# Patient Record
Sex: Male | Born: 1960 | Race: White | Hispanic: No | Marital: Married | Smoking: Never smoker
Health system: Southern US, Community
[De-identification: ages and names within clinical notes are randomized; demographics above are authoritative.]

## PROBLEM LIST (undated history)

## (undated) HISTORY — PX: WISDOM TOOTH EXTRACTION: SHX21

## (undated) HISTORY — PX: KNEE ARTHROSCOPY: SUR90

## (undated) HISTORY — PX: BACK SURGERY: SHX140

## (undated) HISTORY — PX: POLYPECTOMY: SHX149

---

## 2002-08-10 ENCOUNTER — Encounter: Payer: Self-pay | Admitting: Orthopedic Surgery

## 2002-08-10 ENCOUNTER — Ambulatory Visit (HOSPITAL_COMMUNITY): Admission: RE | Admit: 2002-08-10 | Discharge: 2002-08-10 | Payer: Self-pay | Admitting: Orthopedic Surgery

## 2002-09-21 ENCOUNTER — Ambulatory Visit (HOSPITAL_COMMUNITY): Admission: RE | Admit: 2002-09-21 | Discharge: 2002-09-21 | Payer: Self-pay | Admitting: Orthopedic Surgery

## 2003-07-16 ENCOUNTER — Ambulatory Visit (HOSPITAL_COMMUNITY): Admission: RE | Admit: 2003-07-16 | Discharge: 2003-07-17 | Payer: Self-pay | Admitting: Neurological Surgery

## 2003-07-16 ENCOUNTER — Encounter: Payer: Self-pay | Admitting: Neurological Surgery

## 2013-08-31 ENCOUNTER — Encounter: Payer: Self-pay | Admitting: Internal Medicine

## 2013-10-15 ENCOUNTER — Ambulatory Visit (AMBULATORY_SURGERY_CENTER): Payer: Self-pay | Admitting: *Deleted

## 2013-10-15 VITALS — Ht 74.0 in | Wt 187.0 lb

## 2013-10-15 DIAGNOSIS — Z1211 Encounter for screening for malignant neoplasm of colon: Secondary | ICD-10-CM

## 2013-10-15 MED ORDER — MOVIPREP 100 G PO SOLR: ORAL | Status: DC

## 2013-10-15 NOTE — Progress Notes (Signed)
Patient denies any allergies to eggs or soy. 

## 2013-10-17 ENCOUNTER — Encounter: Payer: Self-pay | Admitting: Internal Medicine

## 2013-10-29 ENCOUNTER — Encounter: Payer: Self-pay | Admitting: Internal Medicine

## 2013-10-29 ENCOUNTER — Ambulatory Visit (AMBULATORY_SURGERY_CENTER): Payer: 59 | Admitting: Internal Medicine

## 2013-10-29 VITALS — BP 117/65 | HR 49 | Temp 97.5°F | Resp 16 | Ht 74.0 in | Wt 187.0 lb

## 2013-10-29 DIAGNOSIS — D126 Benign neoplasm of colon, unspecified: Secondary | ICD-10-CM

## 2013-10-29 DIAGNOSIS — Z1211 Encounter for screening for malignant neoplasm of colon: Secondary | ICD-10-CM

## 2013-10-29 HISTORY — PX: COLONOSCOPY: SHX174

## 2013-10-29 MED ORDER — SODIUM CHLORIDE 0.9 % IV SOLN: 500.0000 mL | INTRAVENOUS | Status: DC

## 2013-10-29 NOTE — Progress Notes (Signed)
Called to room to assist during endoscopic procedure.  Patient ID and intended procedure confirmed with present staff. Received instructions for my participation in the procedure from the performing physician.  

## 2013-10-29 NOTE — Patient Instructions (Signed)
YOU HAD AN ENDOSCOPIC PROCEDURE TODAY AT THE [facility]: Refer to the procedure report that was given to you for any specific questions about what was found during the examination.  If the procedure report does not answer your questions, please call your gastroenterologist to clarify.  If you requested that your care partner not be given the details of your procedure findings, then the procedure report has been included in a sealed envelope for you to review at your convenience later.  YOU SHOULD EXPECT: Some feelings of bloating in the abdomen. Passage of more gas than usual.  Walking can help get rid of the air that was put into your GI tract during the procedure and reduce the bloating. If you had a lower endoscopy (such as a colonoscopy or flexible sigmoidoscopy) you may notice spotting of blood in your stool or on the toilet paper. If you underwent a bowel prep for your procedure, then you may not have a normal bowel movement for a few days.  DIET: Your first meal following the procedure should be a light meal and then it is ok to progress to your normal diet.  A half-sandwich or bowl of soup is an example of a good first meal.  Heavy or fried foods are harder to digest and may make you feel nauseous or bloated.  Likewise meals heavy in dairy and vegetables can cause extra gas to form and this can also increase the bloating.  Drink plenty of fluids but you should avoid alcoholic beverages for 24 hours.  ACTIVITY: Your care partner should take you home directly after the procedure.  You should plan to take it easy, moving slowly for the rest of the day.  You can resume normal activity the day after the procedure however you should NOT DRIVE or use heavy machinery for 24 hours (because of the sedation medicines used during the test).    SYMPTOMS TO REPORT IMMEDIATELY: A gastroenterologist can be reached at any hour.  During normal business hours, 8:30 AM to 5:00 PM Monday through Friday,  call [phone].  After hours and on weekends, please call the GI answering service at [phone] who will take a message and have the physician on call contact you.   Following lower endoscopy (colonoscopy or flexible sigmoidoscopy):  Excessive amounts of blood in the stool  Significant tenderness or worsening of abdominal pains  Swelling of the abdomen that is new, acute  Fever of 100F or higher    FOLLOW UP: If any biopsies were taken you will be contacted by phone or by letter within the next 1-3 weeks.  Call your gastroenterologist if you have not heard about the biopsies in 3 weeks.  Our staff will call the home number listed on your records the next business day following your procedure to check on you and address any questions or concerns that you may have at that time regarding the information given to you following your procedure. This is a courtesy call and so if there is no answer at the home number and we have not heard from you through the emergency physician on call, we will assume that you have returned to your regular daily activities without incident.  SIGNATURES/CONFIDENTIALITY: You and/or your care partner have signed paperwork which will be entered into your electronic medical record.  These signatures attest to the fact that that the information above on your After Visit Summary has been reviewed and is understood.  Full responsibility of the confidentiality   of this discharge information lies with you and/or your care-partner.    Information on polyps given to you today 

## 2013-10-29 NOTE — Progress Notes (Signed)
Procedure ends, to recovery, report given and VSS. 

## 2013-10-29 NOTE — Op Note (Signed)
[facility] [address] [place], [address]   COLONOSCOPY PROCEDURE REPORT  PATIENT: Parker, Dunn  MR#: [id number] BIRTHDATE: May 29, 1961 , 52  yrs. old GENDER: Male ENDOSCOPIST: Roxy Cedar, MD REFERRED XB:[id number] Evlyn Kanner, M.D. PROCEDURE DATE:  10/29/2013 PROCEDURE:   Colonoscopy with snare polypectomy x 1 First Screening Colonoscopy - Avg.  risk and is 50 yrs.  old or older Yes.  Prior Negative Screening - Now for repeat screening. N/A  History of Adenoma - Now for follow-up colonoscopy & has been > or = to 3 yrs.  N/A  Polyps Removed Today? Yes. ASA CLASS:   Class I INDICATIONS:average risk screening. MEDICATIONS: MAC sedation, administered by CRNA and propofol (Diprivan) 300mg  IV  DESCRIPTION OF PROCEDURE:   After the risks benefits and alternatives of the procedure were thoroughly explained, informed consent was obtained.  A digital rectal exam revealed no abnormalities of the rectum.   The LB [id number]  endoscope was introduced through the anus and advanced to the cecum, which was identified by both the appendix and ileocecal valve. No adverse events experienced.   The quality of the prep was excellent, using MoviPrep  The instrument was then slowly withdrawn as the colon was fully examined.   COLON FINDINGS: A diminutive polyp was found in the ascending colon. A polypectomy was performed with a cold snare.  The resection was complete and the polyp tissue was completely retrieved.   The colon was otherwise normal.  There was no diverticulosis, inflammation, other polyps or cancers .  Retroflexed views revealed no abnormalities. The time to cecum=7 minutes 31 seconds.  Withdrawal time=12 minutes 24 seconds.  The scope was withdrawn and the procedure completed.  COMPLICATIONS: There were no complications.  ENDOSCOPIC IMPRESSION: 1.   Diminutive polyp was found in the ascending colon; polypectomy was performed with a cold snare 2.    The colon was otherwise normal  RECOMMENDATIONS: 1. Repeat colonoscopy in 5 years if polyp adenomatous; otherwise 10 years   eSigned:  Roxy Cedar, MD 10/29/2013 12:02 PM   cc: Adrian Prince, MD and The Patient

## 2013-10-30 ENCOUNTER — Telehealth: Payer: Self-pay

## 2013-10-30 NOTE — Telephone Encounter (Signed)
  Follow up Call-  Call back number 10/29/2013  Post procedure Call Back phone  # [phone]  Permission to leave phone message Yes     Patient questions:  Do you have a fever, pain , or abdominal swelling? no Pain Score  0 *  Have you tolerated food without any problems? yes  Have you been able to return to your normal activities? yes  Do you have any questions about your discharge instructions: Diet   no Medications  no Follow up visit  no  Do you have questions or concerns about your Care? no  Actions: * If pain score is 4 or above: No action needed, pain <4.

## 2013-11-02 ENCOUNTER — Encounter: Payer: Self-pay | Admitting: Internal Medicine

## 2018-12-20 ENCOUNTER — Encounter: Payer: Self-pay | Admitting: Internal Medicine

## 2019-01-02 ENCOUNTER — Encounter: Payer: Self-pay | Admitting: Internal Medicine

## 2019-01-05 ENCOUNTER — Ambulatory Visit (AMBULATORY_SURGERY_CENTER): Payer: Self-pay | Admitting: *Deleted

## 2019-01-05 VITALS — Ht 74.0 in | Wt 194.0 lb

## 2019-01-05 DIAGNOSIS — Z86010 Personal history of colonic polyps: Secondary | ICD-10-CM

## 2019-01-05 MED ORDER — NA SULFATE-K SULFATE-MG SULF 17.5-3.13-1.6 GM/177ML PO SOLN: ORAL | 0 refills | Status: DC

## 2019-01-05 NOTE — Progress Notes (Signed)
Patient denies any allergies to eggs or soy. Patient denies any problems with anesthesia/sedation. Patient denies any oxygen use at home. Patient denies taking any diet/weight loss medications or blood thinners. EMMI education offered, pt declined. Suprep coupon printed and given to pt.

## 2019-01-22 ENCOUNTER — Encounter: Payer: Self-pay | Admitting: Internal Medicine

## 2019-01-31 ENCOUNTER — Telehealth: Payer: Self-pay

## 2019-01-31 NOTE — Telephone Encounter (Signed)
Called patient and cancelled his procedure for 02/05/2019. I informed him that we would reschedule as soon as we were up and running again. Her agreed and understood.

## 2019-02-05 ENCOUNTER — Encounter: Payer: Self-pay | Admitting: Internal Medicine

## 2019-02-26 ENCOUNTER — Telehealth: Payer: Self-pay | Admitting: *Deleted

## 2019-02-26 NOTE — Telephone Encounter (Signed)
Called patient to reschedule colonoscopy previously cancelled due to Covid-19. Rescheduled with patient to Mar 14, 2019 at 100 pm. Pt has prep, new prep instructions completed and mailed to patient.

## 2019-03-03 ENCOUNTER — Emergency Department (HOSPITAL_BASED_OUTPATIENT_CLINIC_OR_DEPARTMENT_OTHER): Payer: PRIVATE HEALTH INSURANCE

## 2019-03-03 ENCOUNTER — Inpatient Hospital Stay (HOSPITAL_BASED_OUTPATIENT_CLINIC_OR_DEPARTMENT_OTHER)
Admission: EM | Admit: 2019-03-03 | Discharge: 2019-03-06 | DRG: 390 | Disposition: A | Discharge status: Home / Self Care | Payer: PRIVATE HEALTH INSURANCE | Attending: Surgery | Admitting: Surgery

## 2019-03-03 ENCOUNTER — Encounter (HOSPITAL_BASED_OUTPATIENT_CLINIC_OR_DEPARTMENT_OTHER): Payer: Self-pay

## 2019-03-03 ENCOUNTER — Other Ambulatory Visit: Payer: Self-pay

## 2019-03-03 DIAGNOSIS — Z0189 Encounter for other specified special examinations: Secondary | ICD-10-CM

## 2019-03-03 DIAGNOSIS — K56609 Unspecified intestinal obstruction, unspecified as to partial versus complete obstruction: Principal | ICD-10-CM | POA: Diagnosis present

## 2019-03-03 DIAGNOSIS — Z79899 Other long term (current) drug therapy: Secondary | ICD-10-CM | POA: Diagnosis not present

## 2019-03-03 DIAGNOSIS — Z86010 Personal history of colonic polyps: Secondary | ICD-10-CM

## 2019-03-03 LAB — CBC WITH DIFFERENTIAL/PLATELET
Abs Immature Granulocytes: 0.03 10*3/uL (ref 0.00–0.07)
Basophils Absolute: 0 10*3/uL (ref 0.0–0.1)
Basophils Relative: 0 %
Eosinophils Absolute: 0 10*3/uL (ref 0.0–0.5)
Eosinophils Relative: 0 %
HCT: 42.1 % (ref 39.0–52.0)
Hemoglobin: 13.8 g/dL (ref 13.0–17.0)
Immature Granulocytes: 0 %
Lymphocytes Relative: 14 %
Lymphs Abs: 1.1 10*3/uL (ref 0.7–4.0)
MCH: 27.9 pg (ref 26.0–34.0)
MCHC: 32.8 g/dL (ref 30.0–36.0)
MCV: 85.2 fL (ref 80.0–100.0)
Monocytes Absolute: 0.4 10*3/uL (ref 0.1–1.0)
Monocytes Relative: 6 %
Neutro Abs: 6.3 10*3/uL (ref 1.7–7.7)
Neutrophils Relative %: 80 %
Platelets: 177 10*3/uL (ref 150–400)
RBC: 4.94 MIL/uL (ref 4.22–5.81)
RDW: 12.8 % (ref 11.5–15.5)
WBC: 7.9 10*3/uL (ref 4.0–10.5)
nRBC: 0 % (ref 0.0–0.2)

## 2019-03-03 LAB — URINALYSIS, ROUTINE W REFLEX MICROSCOPIC
Bilirubin Urine: NEGATIVE
Glucose, UA: NEGATIVE mg/dL
Hgb urine dipstick: NEGATIVE
Ketones, ur: 80 mg/dL — AB
Leukocytes,Ua: NEGATIVE
Nitrite: NEGATIVE
Protein, ur: NEGATIVE mg/dL
Specific Gravity, Urine: >1.046 — ABNORMAL HIGH (ref 1.005–1.030)
pH: 8 (ref 5.0–8.0)

## 2019-03-03 LAB — LIPASE, BLOOD: Lipase: 36 U/L (ref 11–51)

## 2019-03-03 LAB — COMPREHENSIVE METABOLIC PANEL
ALT: 20 U/L (ref 0–44)
AST: 29 U/L (ref 15–41)
Albumin: 4 g/dL (ref 3.5–5.0)
Alkaline Phosphatase: 43 U/L (ref 38–126)
Anion gap: 8 (ref 5–15)
BUN: 17 mg/dL (ref 6–20)
CO2: 25 mmol/L (ref 22–32)
Calcium: 9 mg/dL (ref 8.9–10.3)
Chloride: 102 mmol/L (ref 98–111)
Creatinine, Ser: 0.83 mg/dL (ref 0.61–1.24)
GFR calc Af Amer: >60 mL/min (ref >60)
GFR calc non Af Amer: >60 mL/min (ref >60)
Glucose, Bld: 123 mg/dL — ABNORMAL HIGH (ref 70–99)
Potassium: 3.7 mmol/L (ref 3.5–5.1)
Sodium: 135 mmol/L (ref 135–145)
Total Bilirubin: 0.8 mg/dL (ref 0.3–1.2)
Total Protein: 6.4 g/dL — ABNORMAL LOW (ref 6.5–8.1)

## 2019-03-03 LAB — TROPONIN I: Troponin I: <0.03 ng/mL (ref <0.03)

## 2019-03-03 MED ORDER — LIDOCAINE VISCOUS HCL 2 % MT SOLN
15.0000 mL | Freq: Once | OROMUCOSAL | Status: DC
  Filled 2019-03-03: qty 15

## 2019-03-03 MED ORDER — ONDANSETRON HCL 4 MG/2ML IJ SOLN: 4.0000 mg | Freq: Four times a day (QID) | INTRAMUSCULAR | Status: DC | PRN

## 2019-03-03 MED ORDER — HYDROMORPHONE HCL 1 MG/ML IJ SOLN
1.0000 mg | Freq: Once | INTRAMUSCULAR | Status: AC
  Administered 2019-03-03: 1 mg via INTRAVENOUS
  Filled 2019-03-03: qty 1

## 2019-03-03 MED ORDER — ONDANSETRON HCL 4 MG/2ML IJ SOLN
4.0000 mg | Freq: Once | INTRAMUSCULAR | Status: AC
  Administered 2019-03-03: 17:00:00 4 mg via INTRAVENOUS
  Filled 2019-03-03: qty 2

## 2019-03-03 MED ORDER — ALUM & MAG HYDROXIDE-SIMETH 200-200-20 MG/5ML PO SUSP: 30.0000 mL | Freq: Four times a day (QID) | ORAL | Status: DC | PRN

## 2019-03-03 MED ORDER — DIPHENHYDRAMINE HCL 12.5 MG/5ML PO ELIX: 12.5000 mg | ORAL_SOLUTION | Freq: Four times a day (QID) | ORAL | Status: DC | PRN

## 2019-03-03 MED ORDER — LACTATED RINGERS IV BOLUS: 1000.0000 mL | Freq: Three times a day (TID) | INTRAVENOUS | Status: AC | PRN

## 2019-03-03 MED ORDER — GUAIFENESIN-DM 100-10 MG/5ML PO SYRP: 10.0000 mL | ORAL_SOLUTION | ORAL | Status: DC | PRN

## 2019-03-03 MED ORDER — LIP MEDEX EX OINT: 1.0000 "application " | TOPICAL_OINTMENT | Freq: Two times a day (BID) | CUTANEOUS | Status: DC

## 2019-03-03 MED ORDER — LACTATED RINGERS IV SOLN
INTRAVENOUS | Status: DC
  Administered 2019-03-03 – 2019-03-04 (×3): via INTRAVENOUS

## 2019-03-03 MED ORDER — DIPHENHYDRAMINE HCL 50 MG/ML IJ SOLN: 12.5000 mg | Freq: Four times a day (QID) | INTRAMUSCULAR | Status: DC | PRN

## 2019-03-03 MED ORDER — PHENOL 1.4 % MT LIQD: 1.0000 | OROMUCOSAL | Status: DC | PRN

## 2019-03-03 MED ORDER — MENTHOL 3 MG MT LOZG
1.0000 | LOZENGE | OROMUCOSAL | Status: DC | PRN
  Filled 2019-03-03: qty 9

## 2019-03-03 MED ORDER — KETOROLAC TROMETHAMINE 15 MG/ML IJ SOLN
15.0000 mg | Freq: Four times a day (QID) | INTRAMUSCULAR | Status: DC | PRN
  Administered 2019-03-04 (×2): 30 mg via INTRAVENOUS
  Filled 2019-03-03 (×4): qty 2

## 2019-03-03 MED ORDER — ONDANSETRON 4 MG PO TBDP: 4.0000 mg | ORAL_TABLET | Freq: Four times a day (QID) | ORAL | Status: DC | PRN

## 2019-03-03 MED ORDER — METOCLOPRAMIDE HCL 5 MG/ML IJ SOLN: 5.0000 mg | Freq: Three times a day (TID) | INTRAMUSCULAR | Status: DC | PRN

## 2019-03-03 MED ORDER — ACETAMINOPHEN 650 MG RE SUPP: 650.0000 mg | Freq: Four times a day (QID) | RECTAL | Status: DC | PRN

## 2019-03-03 MED ORDER — HYDROCORTISONE (PERIANAL) 2.5 % EX CREA: 1.0000 "application " | TOPICAL_CREAM | Freq: Four times a day (QID) | CUTANEOUS | Status: DC | PRN

## 2019-03-03 MED ORDER — MAGIC MOUTHWASH
15.0000 mL | Freq: Four times a day (QID) | ORAL | Status: DC | PRN
  Filled 2019-03-03: qty 15

## 2019-03-03 MED ORDER — LACTATED RINGERS IV BOLUS
1000.0000 mL | Freq: Once | INTRAVENOUS | Status: AC
  Administered 2019-03-03: 1000 mL via INTRAVENOUS

## 2019-03-03 MED ORDER — HYDROCORTISONE 1 % EX CREA: 1.0000 "application " | TOPICAL_CREAM | Freq: Three times a day (TID) | CUTANEOUS | Status: DC | PRN

## 2019-03-03 MED ORDER — MORPHINE SULFATE (PF) 4 MG/ML IV SOLN
4.0000 mg | Freq: Once | INTRAVENOUS | Status: AC
  Administered 2019-03-03: 4 mg via INTRAVENOUS
  Filled 2019-03-03: qty 1

## 2019-03-03 MED ORDER — DIATRIZOATE MEGLUMINE & SODIUM 66-10 % PO SOLN
90.0000 mL | Freq: Once | ORAL | Status: AC
  Administered 2019-03-04: 90 mL via NASOGASTRIC
  Filled 2019-03-03 (×2): qty 90

## 2019-03-03 MED ORDER — IOHEXOL 300 MG/ML  SOLN
100.0000 mL | Freq: Once | INTRAMUSCULAR | Status: AC | PRN
  Administered 2019-03-03: 100 mL via INTRAVENOUS

## 2019-03-03 MED ORDER — PROCHLORPERAZINE EDISYLATE 10 MG/2ML IJ SOLN: 5.0000 mg | INTRAMUSCULAR | Status: DC | PRN

## 2019-03-03 MED ORDER — ACETAMINOPHEN 325 MG PO TABS
650.0000 mg | ORAL_TABLET | Freq: Four times a day (QID) | ORAL | Status: DC | PRN
  Administered 2019-03-06: 650 mg via ORAL
  Filled 2019-03-03: qty 2

## 2019-03-03 MED ORDER — SODIUM CHLORIDE 0.9 % IV BOLUS
1000.0000 mL | Freq: Once | INTRAVENOUS | Status: AC
  Administered 2019-03-03: 1000 mL via INTRAVENOUS

## 2019-03-03 MED ORDER — METHOCARBAMOL 1000 MG/10ML IJ SOLN
1000.0000 mg | Freq: Four times a day (QID) | INTRAVENOUS | Status: DC | PRN
  Filled 2019-03-03: qty 10

## 2019-03-03 MED ORDER — ENALAPRILAT 1.25 MG/ML IV SOLN
0.6250 mg | Freq: Four times a day (QID) | INTRAVENOUS | Status: DC | PRN
  Filled 2019-03-03: qty 1

## 2019-03-03 MED ORDER — HYDROMORPHONE HCL 1 MG/ML IJ SOLN
0.5000 mg | INTRAMUSCULAR | Status: DC | PRN
  Administered 2019-03-03: 1 mg via INTRAVENOUS
  Administered 2019-03-04: 0.5 mg via INTRAVENOUS
  Administered 2019-03-04: 1 mg via INTRAVENOUS
  Filled 2019-03-03 (×3): qty 1

## 2019-03-03 MED ORDER — ENOXAPARIN SODIUM 40 MG/0.4ML ~~LOC~~ SOLN
40.0000 mg | Freq: Every day | SUBCUTANEOUS | Status: DC
  Filled 2019-03-03: qty 0.4

## 2019-03-03 MED ORDER — SIMETHICONE 80 MG PO CHEW: 40.0000 mg | CHEWABLE_TABLET | Freq: Four times a day (QID) | ORAL | Status: DC | PRN

## 2019-03-03 MED ORDER — BISACODYL 10 MG RE SUPP: 10.0000 mg | Freq: Two times a day (BID) | RECTAL | Status: DC | PRN

## 2019-03-03 NOTE — H&P (Signed)
Parker Dunn  Jun 30, 1961 [id number]  CARE TEAM:  PCP: Reynold Bowen, MD  Outpatient Care Team: Patient Care Team: Reynold Bowen, MD as PCP - General (Endocrinology) Irene Shipper, MD as Consulting Physician (Gastroenterology)  Inpatient Treatment Team: Treatment Team: Attending Provider: Quintella Reichert, MD; Physician Assistant: Bishop Dublin; Registered Nurse: Tamera Reason, RN; Technician: Hezzie Bump, EMT; Consulting Physician: Edison Pace, Md, MD   This patient is a 58 y.o.male who presents today for surgical evaluation at the request of Sherwood Gambler.   Chief complaint / Reason for evaluation: SBO  58 year old male with abdominal cramping earlier today.  Usually does long bike rides.  Persistent cramping through doing his chores.  No sick contacts.  No diarrhea.  Pain is migrated more towards the lower abdomen.  Initially called emergency services.  Then felt little bit better and was changing his mind when he had much more intense pain.  Based concerns came to [place].  Does have a history of a sessile serrated polyp removed in 2014.  He was due for a follow-up 5-year colonoscopy when the COVID pandemic happened, so that is been stable.  Usually moves his bowels every day.  He is quite physically active and bicycles 30 miles at a time.  He denies any abdominal surgeries.  No hematochezia or melena.  No food poisoning.  No personal nor family history of GI/colon cancer, inflammatory bowel disease, irritable bowel syndrome, allergy such as Celiac Sprue, dietary/dairy problems, colitis, ulcers nor gastritis.  No recent sick contacts/gastroenteritis.  No travel outside the country.  No changes in diet.  No dysphagia to solids or liquids.  No significant heartburn or reflux.  No hematochezia, hematemesis, coffee ground emesis.  No evidence of prior gastric/peptic ulceration.    Assessment  Parker Dunn  58 y.o. male       Problem List:   Active Problems:   SBO (small bowel obstruction) ([place])   History of SESSILE SERRATED POLYP colon 2014   Crampy abdominal pain with dilated small bowel suspicious for bowel obstruction in a patient with no prior abdominal surgeries.  Plan:  Admit.  IV fluids.  Pain control.  Nausea control.  Nasogastric tube decompression.  Small bowel protocol.  If he has worsening pain or does not clear up in a few days, may require surgery.  -VTE prophylaxis- SCDs, etc -mobilize as tolerated to help recovery  35 minutes spent in review, evaluation, examination, counseling, and coordination of care.  More than 50% of that time was spent in counseling.  Adin Hector, MD, FACS, MASCRS Gastrointestinal and Minimally Invasive Surgery    [address] [place], [place] [address] [phone] Main / Paging [phone] Fax   03/03/2019      History reviewed. No pertinent past medical history.  Past Surgical History:  Procedure Laterality Date  . BACK SURGERY     x2  . COLONOSCOPY  10/29/2013  . KNEE ARTHROSCOPY     x2  . POLYPECTOMY    . WISDOM TOOTH EXTRACTION      Social History   Socioeconomic History  . Marital status: Married    Spouse name: Not on file  . Number of children: Not on file  . Years of education: Not on file  . Highest education level: Not on file  Occupational History  . Not on file  Social Needs  . Financial resource strain: Not on file  . Food insecurity:  Worry: Not on file    Inability: Not on file  . Transportation needs:    Medical: Not on file    Non-medical: Not on file  Tobacco Use  . Smoking status: Never Smoker  . Smokeless tobacco: Never Used  Substance and Sexual Activity  . Alcohol use: Yes    Alcohol/week: 2.0 standard drinks    Types: 2 Standard drinks or equivalent per week  . Drug use: No  . Sexual activity: Not on file  Lifestyle  . Physical activity:    Days per week: Not on file    Minutes  per session: Not on file  . Stress: Not on file  Relationships  . Social connections:    Talks on phone: Not on file    Gets together: Not on file    Attends religious service: Not on file    Active member of club or organization: Not on file    Attends meetings of clubs or organizations: Not on file    Relationship status: Not on file  . Intimate partner violence:    Fear of current or ex partner: Not on file    Emotionally abused: Not on file    Physically abused: Not on file    Forced sexual activity: Not on file  Other Topics Concern  . Not on file  Social History Narrative  . Not on file    Family History  Problem Relation Age of Onset  . Breast cancer Mother   . Colon cancer Neg Hx   . Colon polyps Neg Hx   . Esophageal cancer Neg Hx   . Stomach cancer Neg Hx   . Rectal cancer Neg Hx     Current Facility-Administered Medications  Medication Dose Route Frequency Provider Last Rate Last Dose  . diatrizoate meglumine-sodium (GASTROGRAFIN) 66-10 % solution 90 mL  90 mL Per NG tube Once Michael Boston, MD      . lactated ringers bolus 1,000 mL  1,000 mL Intravenous Q8H PRN Hezzie Karim, Remo Lipps, MD      . lidocaine (XYLOCAINE) 2 % viscous mouth solution 15 mL  15 mL Mouth/Throat Once Sherwood Gambler, MD       Current Outpatient Medications  Medication Sig Dispense Refill  . Cholecalciferol (VITAMIN D PO) Take 1 tablet by mouth daily.    . Na Sulfate-K Sulfate-Mg Sulf 17.5-3.13-1.6 GM/177ML SOLN Suprep (no substitutions)-TAKE AS DIRECTED. 354 mL 0     No Known Allergies  ROS:   All other systems reviewed & are negative except per HPI or as noted below: Constitutional:  No fevers, chills, sweats.  Weight stable Eyes:  No vision changes, No discharge HENT:  No sore throats, nasal drainage Lymph: No neck swelling, No bruising easily Pulmonary:  No cough, productive sputum CV: No orthopnea, PND  Patient bicycles 30 miles without difficulty.  No exertional chest/neck/shoulder/arm  pain. GI: No personal nor family history of GI/colon cancer, inflammatory bowel disease, irritable bowel syndrome, allergy such as Celiac Sprue, dietary/dairy problems, colitis, ulcers nor gastritis.  No recent sick contacts/gastroenteritis.  No travel outside the country.  No changes in diet. Renal: No UTIs, No hematuria Genital:  No drainage, bleeding, masses Musculoskeletal: No severe joint pain.  Good ROM major joints Skin:  No sores or lesions.  No rashes Heme/Lymph:  No easy bleeding.  No swollen lymph nodes Neuro: No focal weakness/numbness.  No seizures Psych: No suicidal ideation.  No hallucinations  BP (!) 153/72   Pulse (!) 57  Temp 97.7 F (36.5 C) (Oral)   Resp 20   Ht 6\' 2"  (1.88 m)   Wt 81.6 kg   SpO2 100%   BMI 23.11 kg/m   Physical Exam: General: Pt awake/alert/oriented x4 in mild major acute distress.  Looks tired and uncomfortable but not toxic Eyes: PERRL, normal EOM. Sclera nonicteric Neuro: CN II-XII intact w/o focal sensory/motor deficits. Lymph: No head/neck/groin lymphadenopathy Psych:  No delerium/psychosis/paranoia HENT: Normocephalic, Mucus membranes moist.  No thrush Neck: Supple, No tracheal deviation Chest: No pain.  Good respiratory excursion. CV:  Pulses intact.  Regular rhythm Abdomen: Mostly soft.  Some central discomfort.  Mild discomfort with percussion but no severe peritonitis.  Mild diastases.  Umbilicus flat without any obvious hernia.   Gen:  No inguinal hernias.  No inguinal lymphadenopathy.   Ext:  SCDs BLE.  No significant edema.  No cyanosis Skin: No petechiae / purpurea.  No major sores Musculoskeletal: No severe joint pain.  Good ROM major joints   Results:   Labs: Results for orders placed or performed during the hospital encounter of 03/03/19 (from the past 48 hour(s))  Comprehensive metabolic panel     Status: Abnormal   Collection Time: 03/03/19  3:09 PM  Result Value Ref Range   Sodium 135 135 - 145 mmol/L   Potassium  3.7 3.5 - 5.1 mmol/L   Chloride 102 98 - 111 mmol/L   CO2 25 22 - 32 mmol/L   Glucose, Bld 123 (H) 70 - 99 mg/dL   BUN 17 6 - 20 mg/dL   Creatinine, Ser 0.83 0.61 - 1.24 mg/dL   Calcium 9.0 8.9 - 10.3 mg/dL   Total Protein 6.4 (L) 6.5 - 8.1 g/dL   Albumin 4.0 3.5 - 5.0 g/dL   AST 29 15 - 41 U/L   ALT 20 0 - 44 U/L   Alkaline Phosphatase 43 38 - 126 U/L   Total Bilirubin 0.8 0.3 - 1.2 mg/dL   GFR calc non Af Amer >60 >60 mL/min   GFR calc Af Amer >60 >60 mL/min   Anion gap 8 5 - 15    Comment: Performed at [facility], [address] [place], [place] [address]  Lipase, blood     Status: None   Collection Time: 03/03/19  3:09 PM  Result Value Ref Range   Lipase 36 11 - 51 U/L    Comment: Performed at [facility], [address] [place], [place] [address]  CBC with Differential     Status: None   Collection Time: 03/03/19  3:09 PM  Result Value Ref Range   WBC 7.9 4.0 - 10.5 K/uL   RBC 4.94 4.22 - 5.81 MIL/uL   Hemoglobin 13.8 13.0 - 17.0 g/dL   HCT 42.1 39.0 - 52.0 %   MCV 85.2 80.0 - 100.0 fL   MCH 27.9 26.0 - 34.0 pg   MCHC 32.8 30.0 - 36.0 g/dL   RDW 12.8 11.5 - 15.5 %   Platelets 177 150 - 400 K/uL   nRBC 0.0 0.0 - 0.2 %   Neutrophils Relative % 80 %   Neutro Abs 6.3 1.7 - 7.7 K/uL   Lymphocytes Relative 14 %   Lymphs Abs 1.1 0.7 - 4.0 K/uL   Monocytes Relative 6 %   Monocytes Absolute 0.4 0.1 - 1.0 K/uL   Eosinophils Relative 0 %   Eosinophils Absolute 0.0 0.0 - 0.5 K/uL   Basophils Relative 0 %  Basophils Absolute 0.0 0.0 - 0.1 K/uL   Immature Granulocytes 0 %   Abs Immature Granulocytes 0.03 0.00 - 0.07 K/uL    Comment: Performed at [facility], [address] [place], [place] [address]  Troponin I - Once     Status: None   Collection Time: 03/03/19  3:09 PM  Result Value Ref Range   Troponin I <0.03 <0.03 ng/mL    Comment: Performed at [facility], [address] [place], [place] [address]  Urinalysis,  Routine w reflex microscopic     Status: Abnormal   Collection Time: 03/03/19  9:16 PM  Result Value Ref Range   Color, Urine YELLOW YELLOW   APPearance CLEAR CLEAR   Specific Gravity, Urine >1.046 (H) 1.005 - 1.030   pH 8.0 5.0 - 8.0   Glucose, UA NEGATIVE NEGATIVE mg/dL   Hgb urine dipstick NEGATIVE NEGATIVE   Bilirubin Urine NEGATIVE NEGATIVE   Ketones, ur 80 (A) NEGATIVE mg/dL   Protein, ur NEGATIVE NEGATIVE mg/dL   Nitrite NEGATIVE NEGATIVE   Leukocytes,Ua NEGATIVE NEGATIVE    Comment: Performed at [facility], [place] [address] [place], [place] [address]    Imaging / Studies: Ct Abdomen Pelvis W Contrast  Result Date: 03/03/2019 CLINICAL DATA:  Abdominal pain and tenderness for a day. EXAM: CT ABDOMEN AND PELVIS WITH CONTRAST TECHNIQUE: Multidetector CT imaging of the abdomen and pelvis was performed using the standard protocol following bolus administration of intravenous contrast. CONTRAST:  166mL OMNIPAQUE IOHEXOL 300 MG/ML  SOLN COMPARISON:  None. FINDINGS: Lower chest: No acute abnormality. Hepatobiliary: No focal liver abnormality is seen. No gallstones, gallbladder wall thickening, or biliary dilatation. Pancreas: Unremarkable. No pancreatic ductal dilatation or surrounding inflammatory changes. Spleen: Normal in size without focal abnormality. Adrenals/Urinary Tract: Calcification in the left adrenal gland with no associated mass, likely from previous hemorrhage or infection. Right adrenal gland is normal. There is a tiny cyst in the left kidney. No suspicious renal masses are noted. No hydronephrosis. The ureters and bladder are normal. Stomach/Bowel: The stomach is normal. Multiple dilated loops of small bowel are identified from the mid jejunum to the proximal to mid ileum. The transition point appears to be in the mid anterior abdomen in approximately the location of coronal image 31 and axial image 54. A discrete transition point is not seen. The more distal  small bowel is decompressed. There is fecal loading in the proximal colon. The remainder of the colon is unremarkable. The appendix is not well seen but there is no secondary evidence of appendicitis. Vascular/Lymphatic: Mild atherosclerotic changes in the nonaneurysmal aorta. The SMV is not well opacified, thought to be due to timing of contrast. Reproductive: Prostate is unremarkable. Other: No free air or free fluid. Musculoskeletal: No acute or significant osseous findings. IMPRESSION: 1. The findings are consistent with a small-bowel obstruction. While a discrete transition point is not seen, I suspect the transition point is likely in the proximal to mid ileum in the anterior central abdomen. An underlying cause for obstruction is not identified. 2. Mild atherosclerotic changes in the nonaneurysmal aorta. Electronically Signed   By: Dorise Bullion III M.D   On: 03/03/2019 17:23   Dg Abd Portable 1v-small Bowel Protocol-position Verification  Result Date: 03/03/2019 CLINICAL DATA:  Evaluate NG tube placement EXAM: PORTABLE ABDOMEN - 1 VIEW COMPARISON:  None. FINDINGS: The side port of the NG 2 is just below the GE junction. The distal tip is in  the region of the gastric fundus. IMPRESSION: The side port of the NG tube is just below the GE junction with the distal tip in the gastric fundus. Electronically Signed   By: Dorise Bullion III M.D   On: 03/03/2019 18:37    Medications / Allergies: per chart  Antibiotics: Anti-infectives (From admission, onward)   None        Note: Portions of this report may have been transcribed using voice recognition software. Every effort was made to ensure accuracy; however, inadvertent computerized transcription errors may be present.   Any transcriptional errors that result from this process are unintentional.    Adin Hector, MD, FACS, MASCRS Gastrointestinal and Minimally Invasive Surgery    [address] [place], [place]  [address] [phone] Main / Paging [phone] Fax   03/03/2019

## 2019-03-03 NOTE — ED Provider Notes (Signed)
58 year old male with no significant past medical history presented to [facility] emergency department today for evaluation of abdominal pain.  CT of the abdomen showed findings consistent with small bowel obstruction.  No underlying cause of obstruction identified.  General surgery was consulted and patient was transferred ED to ED for evaluation.  On my evaluation, patient states nausea has improved.  Is still complaining of some mild abdominal discomfort.  He has some left lower quadrant tenderness on exam.  Decreased bowel sounds.  General surgery consulted and Dr. Johney Maine is aware of the patient.   Pt admitted to general surgery service.    Bishop Dublin 03/03/19 2312    Quintella Reichert, MD 03/05/19 1840

## 2019-03-03 NOTE — ED Provider Notes (Signed)
[facility] EMERGENCY DEPARTMENT Provider Note   CSN: [id number] Arrival date & time: 03/03/19  1450    History   Chief Complaint Chief Complaint  Patient presents with   Abdominal Pain    HPI Parker Dunn is a 58 y.o. male.     HPI  58 year old male presents with acute abdominal cramping.  He states that he was riding his bike like he typically does for 30 mile ride.  About halfway through at approximately 1030 or 11 he started feeling abdominal cramping.  Finished his ride.  Was still having some of the cramping and got on his riding lawnmower but it seemed to be getting worse.  It is periumbilical bilaterally.  He also started feeling some pain in his bilateral flanks.  He states he did urinate and it was clear.  He never had vomiting.  He has had 2 normal bowel movements one this morning and one this afternoon.  He never had chest pain though he started feeling he was breathing fast.  The pain is currently about a 4 5 out of 10.  He is noticing some more pain in his lower abdomen. Transiently felt some tingling in fingertips bilaterally.  Patient had EMS come out but then declined to come in at first but then started to feel worse.  He states they did an ECG which was reportedly okay.  Prior to this he was feeling fine. No cough or dyspnea.  History reviewed. No pertinent past medical history.  Patient Active Problem List   Diagnosis Date Noted   SBO (small bowel obstruction) ([place]) 03/03/2019    Past Surgical History:  Procedure Laterality Date   BACK SURGERY     x2   COLONOSCOPY  10/29/2013   KNEE ARTHROSCOPY     x2   POLYPECTOMY     WISDOM TOOTH EXTRACTION          Home Medications    Prior to Admission medications   Medication Sig Start Date End Date Taking? Authorizing Provider  Cholecalciferol (VITAMIN D PO) Take 1 tablet by mouth daily.    [provider]  Na Sulfate-K Sulfate-Mg Sulf 17.5-3.13-1.6 GM/177ML SOLN Suprep (no  substitutions)-TAKE AS DIRECTED. 01/05/19   Irene Shipper, MD    Family History Family History  Problem Relation Age of Onset   Breast cancer Mother    Colon cancer Neg Hx    Colon polyps Neg Hx    Esophageal cancer Neg Hx    Stomach cancer Neg Hx    Rectal cancer Neg Hx     Social History Social History   Tobacco Use   Smoking status: Never Smoker   Smokeless tobacco: Never Used  Substance Use Topics   Alcohol use: Yes    Alcohol/week: 2.0 standard drinks    Types: 2 Standard drinks or equivalent per week   Drug use: No     Allergies   Patient has no known allergies.   Review of Systems Review of Systems  Constitutional: Negative for fever.  Respiratory: Negative for cough.   Cardiovascular: Negative for chest pain.  Gastrointestinal: Positive for abdominal pain. Negative for constipation, diarrhea and vomiting.  Genitourinary: Positive for flank pain. Negative for dysuria.  All other systems reviewed and are negative.    Physical Exam Updated Vital Signs BP (!) 153/72    Pulse (!) 57    Temp 97.7 F (36.5 C) (Oral)    Resp 20    Ht 6\' 2"  (1.88 m)  Wt 81.6 kg    SpO2 100%    BMI 23.11 kg/m   Physical Exam Vitals signs and nursing note reviewed.  Constitutional:      Appearance: He is well-developed.  HENT:     Head: Normocephalic and atraumatic.     Right Ear: External ear normal.     Left Ear: External ear normal.     Nose: Nose normal.  Eyes:     General:        Right eye: No discharge.        Left eye: No discharge.  Neck:     Musculoskeletal: Neck supple.  Cardiovascular:     Rate and Rhythm: Normal rate and regular rhythm.     Heart sounds: Normal heart sounds.  Pulmonary:     Effort: Pulmonary effort is normal.     Breath sounds: Normal breath sounds.  Abdominal:     Palpations: Abdomen is soft.     Tenderness: There is abdominal tenderness in the left lower quadrant. There is no right CVA tenderness or left CVA tenderness.        Comments: Mild bilateral tenderness around umbilicus. More tenderness in LLQ  Skin:    General: Skin is warm and dry.  Neurological:     Mental Status: He is alert.  Psychiatric:        Mood and Affect: Mood is not anxious.      ED Treatments / Results  Labs (all labs ordered are listed, but only abnormal results are displayed) Labs Reviewed  COMPREHENSIVE METABOLIC PANEL - Abnormal; Notable for the following components:      Result Value   Glucose, Bld 123 (*)    Total Protein 6.4 (*)    All other components within normal limits  LIPASE, BLOOD  CBC WITH DIFFERENTIAL/PLATELET  TROPONIN I  URINALYSIS, ROUTINE W REFLEX MICROSCOPIC    EKG EKG Interpretation  Date/Time:  Saturday Mar 03 2019 15:27:37 EDT Ventricular Rate:  56 PR Interval:    QRS Duration: 100 QT Interval:  473 QTC Calculation: 457 R Axis:   97 Text Interpretation:  Sinus rhythm Left atrial enlargement Borderline right axis deviation Baseline wander in lead(s) I II III aVR aVL aVF V2 baseline wander limits interpretation, otherwise no acute ST/T changes Confirmed by Sherwood Gambler [phone]) on 03/03/2019 3:31:28 PM   Radiology Ct Abdomen Pelvis W Contrast  Result Date: 03/03/2019 CLINICAL DATA:  Abdominal pain and tenderness for a day. EXAM: CT ABDOMEN AND PELVIS WITH CONTRAST TECHNIQUE: Multidetector CT imaging of the abdomen and pelvis was performed using the standard protocol following bolus administration of intravenous contrast. CONTRAST:  153mL OMNIPAQUE IOHEXOL 300 MG/ML  SOLN COMPARISON:  None. FINDINGS: Lower chest: No acute abnormality. Hepatobiliary: No focal liver abnormality is seen. No gallstones, gallbladder wall thickening, or biliary dilatation. Pancreas: Unremarkable. No pancreatic ductal dilatation or surrounding inflammatory changes. Spleen: Normal in size without focal abnormality. Adrenals/Urinary Tract: Calcification in the left adrenal gland with no associated mass, likely from  previous hemorrhage or infection. Right adrenal gland is normal. There is a tiny cyst in the left kidney. No suspicious renal masses are noted. No hydronephrosis. The ureters and bladder are normal. Stomach/Bowel: The stomach is normal. Multiple dilated loops of small bowel are identified from the mid jejunum to the proximal to mid ileum. The transition point appears to be in the mid anterior abdomen in approximately the location of coronal image 31 and axial image 54. A discrete transition point is not seen.  The more distal small bowel is decompressed. There is fecal loading in the proximal colon. The remainder of the colon is unremarkable. The appendix is not well seen but there is no secondary evidence of appendicitis. Vascular/Lymphatic: Mild atherosclerotic changes in the nonaneurysmal aorta. The SMV is not well opacified, thought to be due to timing of contrast. Reproductive: Prostate is unremarkable. Other: No free air or free fluid. Musculoskeletal: No acute or significant osseous findings. IMPRESSION: 1. The findings are consistent with a small-bowel obstruction. While a discrete transition point is not seen, I suspect the transition point is likely in the proximal to mid ileum in the anterior central abdomen. An underlying cause for obstruction is not identified. 2. Mild atherosclerotic changes in the nonaneurysmal aorta. Electronically Signed   By: Dorise Bullion III M.D   On: 03/03/2019 17:23   Dg Abd Portable 1v-small Bowel Protocol-position Verification  Result Date: 03/03/2019 CLINICAL DATA:  Evaluate NG tube placement EXAM: PORTABLE ABDOMEN - 1 VIEW COMPARISON:  None. FINDINGS: The side port of the NG 2 is just below the GE junction. The distal tip is in the region of the gastric fundus. IMPRESSION: The side port of the NG tube is just below the GE junction with the distal tip in the gastric fundus. Electronically Signed   By: Dorise Bullion III M.D   On: 03/03/2019 18:37     Procedures Procedures (including critical care time)  Medications Ordered in ED Medications  diatrizoate meglumine-sodium (GASTROGRAFIN) 66-10 % solution 90 mL (has no administration in time range)  lactated ringers bolus 1,000 mL (has no administration in time range)  lidocaine (XYLOCAINE) 2 % viscous mouth solution 15 mL (15 mLs Mouth/Throat Refused 03/03/19 1836)  lactated ringers bolus 1,000 mL ( Intravenous Stopped 03/03/19 1622)  iohexol (OMNIPAQUE) 300 MG/ML solution 100 mL (100 mLs Intravenous Contrast Given 03/03/19 1636)  ondansetron (ZOFRAN) injection 4 mg (4 mg Intravenous Given 03/03/19 1709)  morphine 4 MG/ML injection 4 mg (4 mg Intravenous Given 03/03/19 1711)  lactated ringers bolus 1,000 mL (1,000 mLs Intravenous New Bag/Given 03/03/19 1749)  morphine 4 MG/ML injection 4 mg (4 mg Intravenous Given 03/03/19 1746)  HYDROmorphone (DILAUDID) injection 1 mg (1 mg Intravenous Given 03/03/19 1820)     Initial Impression / Assessment and Plan / ED Course  I have reviewed the triage vital signs and the nursing notes.  Pertinent labs & imaging results that were available during my care of the patient were reviewed by me and considered in my medical decision making (see chart for details).        Patient has had progressively worsening pain and now had an episode of vomiting in the ED.  His CT scan shows small bowel obstruction though no clear transition point.  I discussed with Dr. Johney Maine, who has reviewed CT and advises NG and transfer to the [facility] long emergency department.  I discussed with Dr. Ralene Bathe, who accepts in transfer and is aware of plan.  Final Clinical Impressions(s) / ED Diagnoses   Final diagnoses:  Small bowel obstruction [facility])    ED Discharge Orders    None       Sherwood Gambler, MD 03/03/19 2019

## 2019-03-03 NOTE — ED Notes (Signed)
ED Provider at bedside. 

## 2019-03-03 NOTE — ED Notes (Signed)
Report to Maylon Cos, Agricultural consultant at [facility] ED.

## 2019-03-03 NOTE — ED Triage Notes (Signed)
Pt reports sharp abdominal pain and tenderness x 1 day. Pt was cutting the grass when he noticed it and it got worse and worse. EMS called and recommended pt been seen. Last BM today. No N/V/D

## 2019-03-04 ENCOUNTER — Inpatient Hospital Stay (HOSPITAL_COMMUNITY): Payer: PRIVATE HEALTH INSURANCE

## 2019-03-04 LAB — CBC
HCT: 41.7 % (ref 39.0–52.0)
Hemoglobin: 13.1 g/dL (ref 13.0–17.0)
MCH: 27.6 pg (ref 26.0–34.0)
MCHC: 31.4 g/dL (ref 30.0–36.0)
MCV: 88 fL (ref 80.0–100.0)
Platelets: 178 10*3/uL (ref 150–400)
RBC: 4.74 MIL/uL (ref 4.22–5.81)
RDW: 13 % (ref 11.5–15.5)
WBC: 8.3 10*3/uL (ref 4.0–10.5)
nRBC: 0 % (ref 0.0–0.2)

## 2019-03-04 LAB — BASIC METABOLIC PANEL
Anion gap: 7 (ref 5–15)
BUN: 12 mg/dL (ref 6–20)
CO2: 26 mmol/L (ref 22–32)
Calcium: 8.6 mg/dL — ABNORMAL LOW (ref 8.9–10.3)
Chloride: 105 mmol/L (ref 98–111)
Creatinine, Ser: 0.98 mg/dL (ref 0.61–1.24)
GFR calc Af Amer: >60 mL/min (ref >60)
GFR calc non Af Amer: >60 mL/min (ref >60)
Glucose, Bld: 117 mg/dL — ABNORMAL HIGH (ref 70–99)
Potassium: 3.7 mmol/L (ref 3.5–5.1)
Sodium: 138 mmol/L (ref 135–145)

## 2019-03-04 LAB — MAGNESIUM: Magnesium: 2.2 mg/dL (ref 1.7–2.4)

## 2019-03-04 LAB — HIV ANTIBODY (ROUTINE TESTING W REFLEX): HIV Screen 4th Generation wRfx: NONREACTIVE

## 2019-03-04 MED ORDER — ORAL CARE MOUTH RINSE: 15.0000 mL | Freq: Two times a day (BID) | OROMUCOSAL | Status: DC

## 2019-03-04 NOTE — Progress Notes (Signed)
Suction on low intermittent. Had issues with suction device going up to full suction. Replaced device.  Charge nurse assessed and aware. Will pass on to day shift nurse.

## 2019-03-04 NOTE — Progress Notes (Signed)
New Admission Note:    Arrival Method: Bed Mental Orientation: A X O X 4 Telemetry: box 64, sinus brady Assessment: complete Skin: intact Iv: Right AC LR @ 100 Pain:  3/10 Tubes: NG tube R Nare Safety Measures: Safety Fall Prevention Plan has been given, discussed and signed Admission: Completed [facility] Orientation: Patient has been orientated to the room, unit, and staff.  Family: n/a  Orders have been reviewed and implemented. Will continue to monitor the patient. Call light has been placed within reach. Patient expresses no questions or concerns.  Tawni Carnes, RN Phone number: [phone]

## 2019-03-04 NOTE — Progress Notes (Addendum)
Parker Dunn [id number] 17-Feb-1961  CARE TEAM:  PCP: Reynold Bowen, MD  Outpatient Care Team: Patient Care Team: Reynold Bowen, MD as PCP - General (Endocrinology) Irene Shipper, MD as Consulting Physician (Gastroenterology)  Inpatient Treatment Team: Treatment Team: Attending Provider: Edison Pace, Md, MD; Consulting Physician: Edison Pace, Md, MD; Registered Nurse: Benjamine Mola, RN   Problem List:   Active Problems:   SBO (small bowel obstruction) ([place])   History of SESSILE SERRATED POLYP colon 2014      * No surgery found *      Assessment  Small bowel obstruction in a patient without any prior abdominal surgery of uncertain etiology.  [facility] Stay = 1 days)  Plan:  NG tube suction with Small bowel protocol.  Advance nasogastric tube in further since tip barely in stomach.  IV fluid rehydration.  Nausea and pain control.    Serial films & exam.  If does not improve or certainly worsens by 48 hours, may require operative exploration.  VTE prophylaxis- SCDs, etc  Mobilize as tolerated to help recovery  5Y f/u colonoscopy once this is resolved.  I am skeptical that he has any recurrent colonic issue with his history of colon polyps.  20 minutes spent in review, evaluation, examination, counseling, and coordination of care.  More than 50% of that time was spent in counseling.  03/04/2019    Subjective: (Chief complaint)  Less crampy abdominal pain.    Objective:  Vital signs:  Vitals:   03/03/19 1830 03/03/19 2315 03/04/19 0059 03/04/19 0540  BP: (!) 153/72 (!) 154/80 (!) 163/79 (!) 161/88  Pulse: (!) 57 (!) 53 (!) 57 (!) 58  Resp: 20 16 17 18   Temp:   98.2 F (36.8 C) 98.2 F (36.8 C)  TempSrc:   Oral Oral  SpO2: 100% 100% 100% 98%  Weight:    82.1 kg  Height:    6\' 2"  (1.88 m)    Last BM Date: 03/03/19  Intake/Output   Yesterday:  05/02 0701 - 05/03 0700 In: 2282.2 [I.V.:316.7; NG/GT:90; IV Piggyback:1875.5] Out: 56  [Urine:900; Emesis/NG output:175] This shift:  No intake/output data recorded.  Bowel function:  Flatus: No  BM:  No  Drain: Thick bilious output from nasogastric tube   Physical Exam:  General: Pt awake/alert/oriented x4 in no acute distress.  More comfortable than in the emergency room Eyes: PERRL, normal EOM.  Sclera clear.  No icterus Neuro: CN II-XII intact w/o focal sensory/motor deficits. Lymph: No head/neck/groin lymphadenopathy Psych:  No delerium/psychosis/paranoia HENT: Normocephalic, Mucus membranes moist.  No thrush Neck: Supple, No tracheal deviation Chest: No chest wall pain w good excursion CV:  Pulses intact.  Regular rhythm MS: Normal AROM mjr joints.  No obvious deformity  Abdomen: Soft.  Mildy distended.  Mild discomfort but no peritonitis.  No evidence of peritonitis.  No incarcerated hernias.  Ext:  No deformity.  No mjr edema.  No cyanosis Skin: No petechiae / purpura  Results:   Cultures: No results found for this or any previous visit (from the past 720 hour(s)).  Labs: Results for orders placed or performed during the hospital encounter of 03/03/19 (from the past 48 hour(s))  Comprehensive metabolic panel     Status: Abnormal   Collection Time: 03/03/19  3:09 PM  Result Value Ref Range   Sodium 135 135 - 145 mmol/L   Potassium 3.7 3.5 - 5.1 mmol/L   Chloride 102 98 - 111 mmol/L   CO2 25 22 - 32 mmol/L  Glucose, Bld 123 (H) 70 - 99 mg/dL   BUN 17 6 - 20 mg/dL   Creatinine, Ser 0.83 0.61 - 1.24 mg/dL   Calcium 9.0 8.9 - 10.3 mg/dL   Total Protein 6.4 (L) 6.5 - 8.1 g/dL   Albumin 4.0 3.5 - 5.0 g/dL   AST 29 15 - 41 U/L   ALT 20 0 - 44 U/L   Alkaline Phosphatase 43 38 - 126 U/L   Total Bilirubin 0.8 0.3 - 1.2 mg/dL   GFR calc non Af Amer >60 >60 mL/min   GFR calc Af Amer >60 >60 mL/min   Anion gap 8 5 - 15    Comment: Performed at [facility], [address] [place], [place] [address]  Lipase, blood     Status: None    Collection Time: 03/03/19  3:09 PM  Result Value Ref Range   Lipase 36 11 - 51 U/L    Comment: Performed at [facility], [address] [place], [place] [address]  CBC with Differential     Status: None   Collection Time: 03/03/19  3:09 PM  Result Value Ref Range   WBC 7.9 4.0 - 10.5 K/uL   RBC 4.94 4.22 - 5.81 MIL/uL   Hemoglobin 13.8 13.0 - 17.0 g/dL   HCT 42.1 39.0 - 52.0 %   MCV 85.2 80.0 - 100.0 fL   MCH 27.9 26.0 - 34.0 pg   MCHC 32.8 30.0 - 36.0 g/dL   RDW 12.8 11.5 - 15.5 %   Platelets 177 150 - 400 K/uL   nRBC 0.0 0.0 - 0.2 %   Neutrophils Relative % 80 %   Neutro Abs 6.3 1.7 - 7.7 K/uL   Lymphocytes Relative 14 %   Lymphs Abs 1.1 0.7 - 4.0 K/uL   Monocytes Relative 6 %   Monocytes Absolute 0.4 0.1 - 1.0 K/uL   Eosinophils Relative 0 %   Eosinophils Absolute 0.0 0.0 - 0.5 K/uL   Basophils Relative 0 %   Basophils Absolute 0.0 0.0 - 0.1 K/uL   Immature Granulocytes 0 %   Abs Immature Granulocytes 0.03 0.00 - 0.07 K/uL    Comment: Performed at [facility], [address] [place], [place] [address]  Troponin I - Once     Status: None   Collection Time: 03/03/19  3:09 PM  Result Value Ref Range   Troponin I <0.03 <0.03 ng/mL    Comment: Performed at [facility], [address] [place], [place] [address]  Urinalysis, Routine w reflex microscopic     Status: Abnormal   Collection Time: 03/03/19  9:16 PM  Result Value Ref Range   Color, Urine YELLOW YELLOW   APPearance CLEAR CLEAR   Specific Gravity, Urine >1.046 (H) 1.005 - 1.030   pH 8.0 5.0 - 8.0   Glucose, UA NEGATIVE NEGATIVE mg/dL   Hgb urine dipstick NEGATIVE NEGATIVE   Bilirubin Urine NEGATIVE NEGATIVE   Ketones, ur 80 (A) NEGATIVE mg/dL   Protein, ur NEGATIVE NEGATIVE mg/dL   Nitrite NEGATIVE NEGATIVE   Leukocytes,Ua NEGATIVE NEGATIVE    Comment: Performed at [facility], [place] [address] [place], [place] [address]  Basic metabolic panel     Status:  Abnormal   Collection Time: 03/04/19  6:02 AM  Result Value Ref Range   Sodium 138 135 - 145 mmol/L   Potassium 3.7 3.5 - 5.1 mmol/L   Chloride 105  98 - 111 mmol/L   CO2 26 22 - 32 mmol/L   Glucose, Bld 117 (H) 70 - 99 mg/dL   BUN 12 6 - 20 mg/dL   Creatinine, Ser 0.98 0.61 - 1.24 mg/dL   Calcium 8.6 (L) 8.9 - 10.3 mg/dL   GFR calc non Af Amer >60 >60 mL/min   GFR calc Af Amer >60 >60 mL/min   Anion gap 7 5 - 15    Comment: Performed at [facility], [place] [address], [place], [place] [address]  CBC     Status: None   Collection Time: 03/04/19  6:02 AM  Result Value Ref Range   WBC 8.3 4.0 - 10.5 K/uL   RBC 4.74 4.22 - 5.81 MIL/uL   Hemoglobin 13.1 13.0 - 17.0 g/dL   HCT 41.7 39.0 - 52.0 %   MCV 88.0 80.0 - 100.0 fL   MCH 27.6 26.0 - 34.0 pg   MCHC 31.4 30.0 - 36.0 g/dL   RDW 13.0 11.5 - 15.5 %   Platelets 178 150 - 400 K/uL   nRBC 0.0 0.0 - 0.2 %    Comment: Performed at [facility], [place] [address] [place], [place] [address]  Magnesium     Status: None   Collection Time: 03/04/19  6:02 AM  Result Value Ref Range   Magnesium 2.2 1.7 - 2.4 mg/dL    Comment: Performed at [facility], [place] [address], [place], [place] [address]    Imaging / Studies: Ct Abdomen Pelvis W Contrast  Result Date: 03/03/2019 CLINICAL DATA:  Abdominal pain and tenderness for a day. EXAM: CT ABDOMEN AND PELVIS WITH CONTRAST TECHNIQUE: Multidetector CT imaging of the abdomen and pelvis was performed using the standard protocol following bolus administration of intravenous contrast. CONTRAST:  168mL OMNIPAQUE IOHEXOL 300 MG/ML  SOLN COMPARISON:  None. FINDINGS: Lower chest: No acute abnormality. Hepatobiliary: No focal liver abnormality is seen. No gallstones, gallbladder wall thickening, or biliary dilatation. Pancreas: Unremarkable. No pancreatic ductal dilatation or surrounding inflammatory changes. Spleen: Normal in size without focal abnormality.  Adrenals/Urinary Tract: Calcification in the left adrenal gland with no associated mass, likely from previous hemorrhage or infection. Right adrenal gland is normal. There is a tiny cyst in the left kidney. No suspicious renal masses are noted. No hydronephrosis. The ureters and bladder are normal. Stomach/Bowel: The stomach is normal. Multiple dilated loops of small bowel are identified from the mid jejunum to the proximal to mid ileum. The transition point appears to be in the mid anterior abdomen in approximately the location of coronal image 31 and axial image 54. A discrete transition point is not seen. The more distal small bowel is decompressed. There is fecal loading in the proximal colon. The remainder of the colon is unremarkable. The appendix is not well seen but there is no secondary evidence of appendicitis. Vascular/Lymphatic: Mild atherosclerotic changes in the nonaneurysmal aorta. The SMV is not well opacified, thought to be due to timing of contrast. Reproductive: Prostate is unremarkable. Other: No free air or free fluid. Musculoskeletal: No acute or significant osseous findings. IMPRESSION: 1. The findings are consistent with a small-bowel obstruction. While a discrete transition point is not seen, I suspect the transition point is likely in the proximal to mid ileum in the anterior central abdomen. An underlying cause for obstruction is not identified. 2. Mild atherosclerotic changes in the nonaneurysmal aorta. Electronically Signed   By: Dorise Bullion III M.D   On:  03/03/2019 17:23   Dg Abd Portable 1v-small Bowel Protocol-position Verification  Result Date: 03/03/2019 CLINICAL DATA:  Evaluate NG tube placement EXAM: PORTABLE ABDOMEN - 1 VIEW COMPARISON:  None. FINDINGS: The side port of the NG 2 is just below the GE junction. The distal tip is in the region of the gastric fundus. IMPRESSION: The side port of the NG tube is just below the GE junction with the distal tip in the gastric  fundus. Electronically Signed   By: Dorise Bullion III M.D   On: 03/03/2019 18:37    Medications / Allergies: per chart  Antibiotics: Anti-infectives (From admission, onward)   None        Note: Portions of this report may have been transcribed using voice recognition software. Every effort was made to ensure accuracy; however, inadvertent computerized transcription errors may be present.   Any transcriptional errors that result from this process are unintentional.     Adin Hector, MD, FACS, MASCRS Gastrointestinal and Minimally Invasive Surgery    [address] [place], [place] [address] [phone] Main / Paging [phone] Fax

## 2019-03-04 NOTE — Progress Notes (Signed)
Gastrografin given @ 0250. X-ray notified. NG clamped for 1 hour. Patient resting, no c/o. Will continue to monitor.

## 2019-03-04 NOTE — Progress Notes (Signed)
NGT advanced 15 cm in R nare as directed, patient tolerated well. Will continue to monitor.

## 2019-03-04 NOTE — ED Notes (Signed)
Phone report given to Tye, Therapist, sports

## 2019-03-05 ENCOUNTER — Telehealth: Payer: Self-pay | Admitting: Internal Medicine

## 2019-03-05 NOTE — Telephone Encounter (Signed)
Pt called and wanted to speak with the nurse. He stated he is currently in the [facility] and wants to know if he should keep or cancel appt for 03/14/2019

## 2019-03-05 NOTE — Telephone Encounter (Signed)
Patient states he is in [facility] now for obstruction, he was admitted Saturday. Pt is scheduled for a colon next week and wanted to see if he needed to cancel that appt. Discussed with pt that he most likely would but would double check with Dr. Henrene Pastor. Please advise.

## 2019-03-05 NOTE — Progress Notes (Signed)
Subjective/Chief Complaint: Patient reports passing flatus several times Denies abdominal pain Denies nausea NG with 600 out last 24 hrs including ice chips   Objective: Vital signs in last 24 hours: Temp:  [98.1 F (36.7 C)-99.6 F (37.6 C)] 98.6 F (37 C) (05/04 0439) Pulse Rate:  [50-56] 52 (05/04 0439) Resp:  [17-19] 19 (05/04 0439) BP: (123-141)/(64-83) 123/64 (05/04 0439) SpO2:  [95 %-100 %] 98 % (05/04 0439) Last BM Date: 03/03/19  Intake/Output from previous day: 05/03 0701 - 05/04 0700 In: 2540.6 [P.O.:30; I.V.:2510.6] Out: 3252 [Urine:2652; Emesis/NG output:600] Intake/Output this shift: No intake/output data recorded.  Exam: Awake and alert Looks comfortable Abdomen soft, non-tender, non-distended  Lab Results:  Recent Labs    03/03/19 1509 03/04/19 0602  WBC 7.9 8.3  HGB 13.8 13.1  HCT 42.1 41.7  PLT 177 178   BMET Recent Labs    03/03/19 1509 03/04/19 0602  NA 135 138  K 3.7 3.7  CL 102 105  CO2 25 26  GLUCOSE 123* 117*  BUN 17 12  CREATININE 0.83 0.98  CALCIUM 9.0 8.6*   PT/INR No results for input(s): LABPROT, INR in the last 72 hours. ABG No results for input(s): PHART, HCO3 in the last 72 hours.  Invalid input(s): PCO2, PO2  Studies/Results: Ct Abdomen Pelvis W Contrast  Result Date: 03/03/2019 CLINICAL DATA:  Abdominal pain and tenderness for a day. EXAM: CT ABDOMEN AND PELVIS WITH CONTRAST TECHNIQUE: Multidetector CT imaging of the abdomen and pelvis was performed using the standard protocol following bolus administration of intravenous contrast. CONTRAST:  134mL OMNIPAQUE IOHEXOL 300 MG/ML  SOLN COMPARISON:  None. FINDINGS: Lower chest: No acute abnormality. Hepatobiliary: No focal liver abnormality is seen. No gallstones, gallbladder wall thickening, or biliary dilatation. Pancreas: Unremarkable. No pancreatic ductal dilatation or surrounding inflammatory changes. Spleen: Normal in size without focal abnormality.  Adrenals/Urinary Tract: Calcification in the left adrenal gland with no associated mass, likely from previous hemorrhage or infection. Right adrenal gland is normal. There is a tiny cyst in the left kidney. No suspicious renal masses are noted. No hydronephrosis. The ureters and bladder are normal. Stomach/Bowel: The stomach is normal. Multiple dilated loops of small bowel are identified from the mid jejunum to the proximal to mid ileum. The transition point appears to be in the mid anterior abdomen in approximately the location of coronal image 31 and axial image 54. A discrete transition point is not seen. The more distal small bowel is decompressed. There is fecal loading in the proximal colon. The remainder of the colon is unremarkable. The appendix is not well seen but there is no secondary evidence of appendicitis. Vascular/Lymphatic: Mild atherosclerotic changes in the nonaneurysmal aorta. The SMV is not well opacified, thought to be due to timing of contrast. Reproductive: Prostate is unremarkable. Other: No free air or free fluid. Musculoskeletal: No acute or significant osseous findings. IMPRESSION: 1. The findings are consistent with a small-bowel obstruction. While a discrete transition point is not seen, I suspect the transition point is likely in the proximal to mid ileum in the anterior central abdomen. An underlying cause for obstruction is not identified. 2. Mild atherosclerotic changes in the nonaneurysmal aorta. Electronically Signed   By: Dorise Bullion III M.D   On: 03/03/2019 17:23   Dg Abd Portable 1v-small Bowel Obstruction Protocol-initial, 8 Hr Delay  Result Date: 03/04/2019 CLINICAL DATA:  Small bowel obstruction, 8 hour delay image EXAM: PORTABLE ABDOMEN - 1 VIEW COMPARISON:  03/03/2019 abdominal radiographs and  CT abdomen/pelvis. FINDINGS: Enteric tube terminates in the gastric fundus. Persistent moderate distention of central abdominal small bowel loops up to 4.4 cm diameter, stable  to mildly worsened. Large colonic stool volume, unchanged. No evidence of pneumatosis or pneumoperitoneum. No radiopaque nephrolithiasis. Excreted IV contrast is seen in the bladder lumen. IMPRESSION: Persistent moderate distention of central abdominal small bowel loops, stable mildly worsened, suggesting persistent distal small bowel obstruction. Stable large colonic stool volume. Enteric tube terminates in the gastric fundus. Electronically Signed   By: Ilona Sorrel M.D.   On: 03/04/2019 11:27   Dg Abd Portable 1v-small Bowel Protocol-position Verification  Result Date: 03/03/2019 CLINICAL DATA:  Evaluate NG tube placement EXAM: PORTABLE ABDOMEN - 1 VIEW COMPARISON:  None. FINDINGS: The side port of the NG 2 is just below the GE junction. The distal tip is in the region of the gastric fundus. IMPRESSION: The side port of the NG tube is just below the GE junction with the distal tip in the gastric fundus. Electronically Signed   By: Dorise Bullion III M.D   On: 03/03/2019 18:37    Anti-infectives: Anti-infectives (From admission, onward)   None      Assessment/Plan: SBO  Given clinical picture, this may be resolving Will clamp NG and try clear liquids. If he continues to improve, will d/c NG later today. If he worsens, surgery tomorrow  LOS: 2 days    Coralie Keens 03/05/2019

## 2019-03-06 NOTE — Discharge Summary (Signed)
Physician Discharge Summary  Patient ID: Parker Dunn MRN: [id number] DOB/AGE: 01/26/1961 58 y.o.  Admit date: 03/03/2019 Discharge date: 03/06/2019  Admission Diagnoses:  SBO  Discharge Diagnoses:  SBO No no abdominal surgical history Active Problems:   SBO (small bowel obstruction) ([place])   History of SESSILE SERRATED POLYP colon 2014   PROCEDURES: None  Hospital Course:  Patient is a 58 year old male presented with abdominal cramping on 03/03/2019.  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  He complains of consistent cramping throughout the day.  Pain migrated to his lower abdomen.  He called EMS, and then decided to go to [facility] for evaluation.  CT scan with contrast on 03/03/2019: The stomach was normal.  There are multiple dilated loops of small bowel identified from mid jejunum to the proximal ileum.  The transition point appeared to be in the abdomen; coronal image 31,  axial image 54 there was fecal loading the proximal colon the remainder of the colon was unremarkable the appendix was not well seen but there is no evidence of appendicitis.  Seen by Dr. Michael Boston in the emergency department and admitted with a small bowel obstruction.  Has no history of sick contacts.  He has had back surgery x2, but no abdominal surgery.  He was admitted to the hospital placed on IV rehydration, bowel rest, and NG decompression.  He underwent small bowel protocol and showed enteric tube terminates in the gastric fundus persistent moderate distention central abdominal small bowel loops up to 4.4 cm in diameter.  Stable to mildly worsened.  Is also a large colonic stool volume no evidence of pneumatosis or pneumoperitoneum. Reported passing flatus several times on 03/05/2019.  His NG was clamped and eventually removed.  Placed on clears.  He did well with clears and reported having bowel movement on 03/06/2019.  Dr. Ninfa Linden advanced him to a soft diet and he was ready for discharge  in the afternoon 03/06/2019.  No follow-up is needed.  He will follow-up for his other medical issues with his primary care.  Condition on discharge: Improving.  CBC Latest Ref Rng & Units 03/04/2019 03/03/2019  WBC 4.0 - 10.5 K/uL 8.3 7.9  Hemoglobin 13.0 - 17.0 g/dL 13.1 13.8  Hematocrit 39.0 - 52.0 % 41.7 42.1  Platelets 150 - 400 K/uL 178 177   CMP Latest Ref Rng & Units 03/04/2019 03/03/2019  Glucose 70 - 99 mg/dL 117(H) 123(H)  BUN 6 - 20 mg/dL 12 17  Creatinine 0.61 - 1.24 mg/dL 0.98 0.83  Sodium 135 - 145 mmol/L 138 135  Potassium 3.5 - 5.1 mmol/L 3.7 3.7  Chloride 98 - 111 mmol/L 105 102  CO2 22 - 32 mmol/L 26 25  Calcium 8.9 - 10.3 mg/dL 8.6(L) 9.0  Total Protein 6.5 - 8.1 g/dL - 6.4(L)  Total Bilirubin 0.3 - 1.2 mg/dL - 0.8  Alkaline Phos 38 - 126 U/L - 43  AST 15 - 41 U/L - 29  ALT 0 - 44 U/L - 20   CT scan 03/03/2019 with contrast: 1. The findings are consistent with a small-bowel obstruction. While a discrete transition point is not seen, I suspect the transition point is likely in the proximal to mid ileum in the anterior central abdomen. An underlying cause for obstruction is not identified. 2. Mild atherosclerotic changes in the nonaneurysmal aorta.  Disposition: Discharge disposition: 01-Home or Self Care        Allergies as of 03/06/2019   No Known  Allergies     Medication List    STOP taking these medications   Aleve PM 220-25 MG Tabs Generic drug:  Naproxen Sod-diphenhydrAMINE   Na Sulfate-K Sulfate-Mg Sulf 17.5-3.13-1.6 GM/177ML Soln     TAKE these medications   naproxen sodium 220 MG tablet Commonly known as:  ALEVE Take 220-440 mg by mouth 2 (two) times daily as needed (pain).      Follow-up Information    Reynold Bowen, MD Follow up.   Specialty:  Endocrinology Why:  CAll and let him follow up with you. Contact information: [address] [place] [phone]           Signed: Earnstine Regal 03/06/2019, 3:48  PM

## 2019-03-06 NOTE — Progress Notes (Signed)
Pt discharged home in stable condition. Discharge instructions given.  Pt verbalized understanding. No immediate questions or concerns at this time. Pt chose to ambulate off unit.

## 2019-03-06 NOTE — Discharge Instructions (Signed)
Bowel Obstruction °A bowel obstruction means that something is blocking the small or large bowel. The bowel is also called the intestine. It is the long tube that connects the stomach to the opening of the butt (anus). When something blocks the bowel, food and fluids cannot pass through like normal. This condition needs to be treated. Treatment depends on the cause of the problem and how bad the problem is. °What are the causes? °Common causes of this condition include: °· Scar tissue (adhesions) from past surgery or from high-energy X-rays (radiation). °· Recent surgery in the belly. This affects how food moves in the bowel. °· Some diseases, such as: °? Irritation of the lining of the digestive tract (Crohn's disease). °? Irritation of small pouches in the bowel (diverticulitis). °· Growths or tumors. °· A bulging organ (hernia). °· Twisting of the bowel (volvulus). °· A foreign body. °· Slipping of a part of the bowel into another part (intussusception). °What are the signs or symptoms? °Symptoms of this condition include: °· Pain in the belly. °· Feeling sick to your stomach (nauseous). °· Throwing up (vomiting). °· Bloating in the belly. °· Being unable to pass gas. °· Trouble pooping (constipation). °· Watery poop (diarrhea). °· A lot of belching. °How is this diagnosed? °This condition may be diagnosed based on: °· A physical exam. °· Medical history. °· Imaging tests, such as X-ray or CT scan. °· Blood tests. °· Urine tests. °How is this treated? °Treatment for this condition may include: °· Fluids and pain medicines that are given through an IV tube. Your doctor may tell you not to eat or drink if you feel sick to your stomach and are throwing up. °· Eating a clear liquid diet for a few days. °· Putting a small tube (nasogastric tube) into the stomach. This will help with pain, discomfort, and nausea by removing blocked air and fluids from the stomach. °· Surgery. This may be needed if other treatments do  not work. °Follow these instructions at home: °Medicines °· Take over-the-counter and prescription medicines only as told by your doctor. °· If you were prescribed an antibiotic medicine, take it as told by your doctor. Do not stop taking the antibiotic even if you start to feel better. °General instructions °· Follow your diet as told by your doctor. You may need to: °? Only drink clear liquids until you start to get better. °? Avoid solid foods. °· Return to your normal activities as told by your doctor. Ask your doctor what activities are safe for you. °· Do not sit for a long time without moving. Get up to take short walks every 1-2 hours. This is important. Ask for help if you feel weak or unsteady. °· Keep all follow-up visits as told by your doctor. This is important. °How is this prevented? °After having a bowel obstruction, you may be more likely to have another. You can do some things to stop it from happening again. °· If you have a long-term (chronic) disease, contact your doctor if you see changes or problems. °· Take steps to prevent or treat trouble pooping. Your doctor may ask that you: °? Drink enough fluid to keep your pee (urine) pale yellow. °? Take over-the-counter or prescription medicines. °? Eat foods that are high in fiber. These include beans, whole grains, and fresh fruits and vegetables. °? Limit foods that are high in fat and sugar. These include fried or sweet foods. °· Stay active. Ask your doctor which exercises are   safe for you.  Avoid stress.  Eat three small meals and three small snacks each day.  Work with a Publishing rights manager (dietitian) to make a meal plan that works for you.  Do not use any products that contain nicotine or tobacco, such as cigarettes and e-cigarettes. If you need help quitting, ask your doctor. Contact a doctor if:  You have a fever.  You have chills. Get help right away if:  You have pain or cramps that get worse.  You throw up blood.  You are  sick to your stomach.  You cannot stop throwing up.  You cannot drink fluids.  You feel mixed up (confused).  You feel very thirsty (dehydrated).  Your belly gets more bloated.  You feel weak or you pass out (faint). Summary  A bowel obstruction means that something is blocking the small or large bowel.  Treatment may include IV fluids and pain medicine. You may also have a clear liquid diet, a small tube in your stomach, or surgery.  Drink clear liquids and avoid solid foods until you get better. This information is not intended to replace advice given to you by your health care provider. Make sure you discuss any questions you have with your health care provider. Document Released: 11/25/2004 Document Revised: 03/01/2018 Document Reviewed: 03/01/2018 Elsevier Interactive Patient Education  2019 [employer]  Viral Gastroenteritis, Adult I'm not sure you have this but here is some information on it, it can cause similar symptoms  Viral gastroenteritis is also known as the stomach flu. This condition is caused by certain germs (viruses). These germs can be passed from person to person very easily (are very contagious). This condition can cause sudden watery poop (diarrhea), fever, and throwing up (vomiting). Having watery poop and throwing up can make you feel weak and cause you to get dehydrated. Dehydration can make you tired and thirsty, make you have a dry mouth, and make it so you pee (urinate) less often. Older adults and people with other diseases or a weak defense system (immune system) are at higher risk for dehydration. It is important to replace the fluids that you lose from having watery poop and throwing up. Follow these instructions at home: Follow instructions from your doctor about how to care for yourself at home. Eating and drinking Follow these instructions as told by your doctor:  Take an oral rehydration solution (ORS). This is a drink that is sold at  pharmacies and stores.  Drink clear fluids in small amounts as you are able, such as: ? Water. ? Ice chips. ? Diluted fruit juice. ? Low-calorie sports drinks.  Eat bland, easy-to-digest foods in small amounts as you are able, such as: ? Bananas. ? Applesauce. ? Rice. ? Low-fat (lean) meats. ? Toast. ? Crackers.  Avoid fluids that have a lot of sugar or caffeine in them.  Avoid alcohol.  Avoid spicy or fatty foods. General instructions   Drink enough fluid to keep your pee (urine) clear or pale yellow.  Wash your hands often. If you cannot use soap and water, use hand sanitizer.  Make sure that all people in your home wash their hands well and often.  Rest at home while you get better.  Take over-the-counter and prescription medicines only as told by your doctor.  Watch your condition for any changes.  Take a warm bath to help with any burning or pain from having watery poop.  Keep all follow-up visits as told by your doctor. This  is important. Contact a doctor if:  You cannot keep fluids down.  Your symptoms get worse.  You have new symptoms.  You feel light-headed or dizzy.  You have muscle cramps. Get help right away if:  You have chest pain.  You feel very weak or you pass out (faint).  You see blood in your throw-up.  Your throw-up looks like coffee grounds.  You have bloody or black poop (stools) or poop that look like tar.  You have a very bad headache, a stiff neck, or both.  You have a rash.  You have very bad pain, cramping, or bloating in your belly (abdomen).  You have trouble breathing.  You are breathing very quickly.  Your heart is beating very quickly.  Your skin feels cold and clammy.  You feel confused.  You have pain when you pee.  You have signs of dehydration, such as: ? Dark pee, hardly any pee, or no pee. ? Cracked lips. ? Dry mouth. ? Sunken eyes. ? Sleepiness. ? Weakness. This information is not intended  to replace advice given to you by your health care provider. Make sure you discuss any questions you have with your health care provider. Document Released: 04/05/2008 Document Revised: 07/12/2018 Document Reviewed: 06/24/2015 Elsevier Interactive Patient Education  2019 [employer].

## 2019-03-06 NOTE — Telephone Encounter (Signed)
It looks like the patient has a small bowel obstruction which is improving.  However, if he worsens and requires surgery, then he should reschedule his colonoscopy until after he has fully recovered.  However, if he does not require surgery and is discharged home and able to tolerate a normal diet without any issues, and feeling well, he can keep plans for colonoscopy as scheduled.  However, if he does not feel up to it, it would be okay to delay for a few weeks until he is ready.  Thanks

## 2019-03-06 NOTE — Telephone Encounter (Signed)
Pt aware and state he wants to see how he does for a few days and will call us back and let us know what he wants to do. States he is being discharged home today.

## 2019-03-06 NOTE — Progress Notes (Signed)
   Subjective/Chief Complaint: No complaints Tolerating liquids NG out Having BM's   Objective: Vital signs in last 24 hours: Temp:  [98.7 F (37.1 C)-99.7 F (37.6 C)] 98.7 F (37.1 C) (05/05 0712) Pulse Rate:  [52-57] 52 (05/05 0712) Resp:  [16-17] 16 (05/05 0712) BP: (120-134)/(72-84) 127/84 (05/05 0712) SpO2:  [98 %-99 %] 98 % (05/05 0712) Last BM Date: 03/03/19  Intake/Output from previous day: 05/04 0701 - 05/05 0700 In: 900 [P.O.:900] Out: 1400 [Urine:1400] Intake/Output this shift: Total I/O In: 600 [P.O.:600] Out: -   Exam: Looks well Abdomen soft, NT/ND  Lab Results:  Recent Labs    03/03/19 1509 03/04/19 0602  WBC 7.9 8.3  HGB 13.8 13.1  HCT 42.1 41.7  PLT 177 178   BMET Recent Labs    03/03/19 1509 03/04/19 0602  NA 135 138  K 3.7 3.7  CL 102 105  CO2 25 26  GLUCOSE 123* 117*  BUN 17 12  CREATININE 0.83 0.98  CALCIUM 9.0 8.6*   PT/INR No results for input(s): LABPROT, INR in the last 72 hours. ABG No results for input(s): PHART, HCO3 in the last 72 hours.  Invalid input(s): PCO2, PO2  Studies/Results: Dg Abd Portable 1v-small Bowel Obstruction Protocol-initial, 8 Hr Delay  Result Date: 03/04/2019 CLINICAL DATA:  Small bowel obstruction, 8 hour delay image EXAM: PORTABLE ABDOMEN - 1 VIEW COMPARISON:  03/03/2019 abdominal radiographs and CT abdomen/pelvis. FINDINGS: Enteric tube terminates in the gastric fundus. Persistent moderate distention of central abdominal small bowel loops up to 4.4 cm diameter, stable to mildly worsened. Large colonic stool volume, unchanged. No evidence of pneumatosis or pneumoperitoneum. No radiopaque nephrolithiasis. Excreted IV contrast is seen in the bladder lumen. IMPRESSION: Persistent moderate distention of central abdominal small bowel loops, stable mildly worsened, suggesting persistent distal small bowel obstruction. Stable large colonic stool volume. Enteric tube terminates in the gastric fundus.  Electronically Signed   By: Ilona Sorrel M.D.   On: 03/04/2019 11:27    Anti-infectives: Anti-infectives (From admission, onward)   None      Assessment/Plan: Resolved SBO  Soft diet Home later today if diet tolerated  LOS: 3 days    Coralie Keens 03/06/2019

## 2019-03-13 ENCOUNTER — Telehealth: Payer: Self-pay | Admitting: *Deleted

## 2019-03-13 NOTE — Telephone Encounter (Signed)
Covid-19 travel screening questions  Have you traveled in the last 14 days?no If yes where?  Do you now or have you had a fever in the last 14 days?no  Do you have any respiratory symptoms of shortness of breath or cough now or in the last 14 days?no  Do you have a medical history of Congestive Heart Failure?  Do you have a medical history of lung disease?  Do you have any family members or close contacts with diagnosed or suspected Covid-19?no  Patient made aware of care partner policy and was told to bring a mask with him if he has one available. SM

## 2019-03-14 ENCOUNTER — Ambulatory Visit (AMBULATORY_SURGERY_CENTER): Payer: PRIVATE HEALTH INSURANCE | Admitting: Internal Medicine

## 2019-03-14 ENCOUNTER — Encounter: Payer: Self-pay | Admitting: Internal Medicine

## 2019-03-14 ENCOUNTER — Other Ambulatory Visit: Payer: Self-pay

## 2019-03-14 VITALS — BP 117/67 | HR 53 | Temp 98.5°F | Resp 16 | Ht 74.0 in | Wt 181.0 lb

## 2019-03-14 DIAGNOSIS — D122 Benign neoplasm of ascending colon: Secondary | ICD-10-CM

## 2019-03-14 DIAGNOSIS — Z86010 Personal history of colonic polyps: Secondary | ICD-10-CM

## 2019-03-14 MED ORDER — SODIUM CHLORIDE 0.9 % IV SOLN: 500.0000 mL | INTRAVENOUS | Status: DC

## 2019-03-14 NOTE — Progress Notes (Signed)
Rica Mote completed vitals.

## 2019-03-14 NOTE — Progress Notes (Signed)
Pt's states no medical or surgical changes since previsit or office visit. 

## 2019-03-14 NOTE — Patient Instructions (Signed)
   Information on polyps given to you today   Await pathology results on polyp removed    YOU HAD AN ENDOSCOPIC PROCEDURE TODAY AT [place]:   Refer to the procedure report that was given to you for any specific questions about what was found during the examination.  If the procedure report does not answer your questions, please call your gastroenterologist to clarify.  If you requested that your care partner not be given the details of your procedure findings, then the procedure report has been included in a sealed envelope for you to review at your convenience later.  YOU SHOULD EXPECT: Some feelings of bloating in the abdomen. Passage of more gas than usual.  Walking can help get rid of the air that was put into your GI tract during the procedure and reduce the bloating. If you had a lower endoscopy (such as a colonoscopy or flexible sigmoidoscopy) you may notice spotting of blood in your stool or on the toilet paper. If you underwent a bowel prep for your procedure, you may not have a normal bowel movement for a few days.  Please Note:  You might notice some irritation and congestion in your nose or some drainage.  This is from the oxygen used during your procedure.  There is no need for concern and it should clear up in a day or so.  SYMPTOMS TO REPORT IMMEDIATELY:   Following lower endoscopy (colonoscopy or flexible sigmoidoscopy):  Excessive amounts of blood in the stool  Significant tenderness or worsening of abdominal pains  Swelling of the abdomen that is new, acute  Fever of 100F or higher    For urgent or emergent issues, a gastroenterologist can be reached at any hour by calling [phone].   DIET:  We do recommend a small meal at first, but then you may proceed to your regular diet.  Drink plenty of fluids but you should avoid alcoholic beverages for 24 hours.  ACTIVITY:  You should plan to take it easy for the rest of today and you should NOT DRIVE  or use heavy machinery until tomorrow (because of the sedation medicines used during the test).    FOLLOW UP: Our staff will call the number listed on your records 48-72 hours following your procedure to check on you and address any questions or concerns that you may have regarding the information given to you following your procedure. If we do not reach you, we will leave a message.  We will attempt to reach you two times.  During this call, we will ask if you have developed any symptoms of COVID 19. If you develop any symptoms (for example fever, flu-like symptoms, shortness of breath, cough etc.) before then, please call [phone].  If any biopsies were taken you will be contacted by phone or by letter within the next 1-3 weeks.  Please call us at [phone] if you have not heard about the biopsies in 3 weeks.    SIGNATURES/CONFIDENTIALITY: You and/or your care partner have signed paperwork which will be entered into your electronic medical record.  These signatures attest to the fact that that the information above on your After Visit Summary has been reviewed and is understood.  Full responsibility of the confidentiality of this discharge information lies with you and/or your care-partner.

## 2019-03-14 NOTE — Progress Notes (Signed)
Called to room to assist during endoscopic procedure.  Patient ID and intended procedure confirmed with present staff. Received instructions for my participation in the procedure from the performing physician.   Freddie Breech, [employer] Kassie Mends, CRNA  [facility] Jerilee Field, RN Procedure Room Team

## 2019-03-14 NOTE — Progress Notes (Signed)
PT taken to PACU. Monitors in place. VSS. Report given to RN. 

## 2019-03-14 NOTE — Op Note (Signed)
Parker Dunn Procedure Date: 03/14/2019 12:52 PM MRN: [id number] Endoscopist: Docia Chuck. Parker Dunn , MD Age: 58 Referring MD:  Date of Birth: January 03, 1961 Gender: Male Account #: [id number] Procedure:                Colonoscopy with cold snare polypectomy x 1 Indications:              High risk colon cancer surveillance: Personal                            history of sessile serrated colon polyp (less than                            10 mm in size) with no dysplasia. Index examination                            October 29, 2013 Medicines:                Monitored Anesthesia Care Procedure:                Pre-Anesthesia Assessment:                           - Prior to the procedure, a History and Physical                            was performed, and patient medications and                            allergies were reviewed. The patient's tolerance of                            previous anesthesia was also reviewed. The risks                            and benefits of the procedure and the sedation                            options and risks were discussed with the patient.                            All questions were answered, and informed consent                            was obtained. Prior Anticoagulants: The patient has                            taken no previous anticoagulant or antiplatelet                            agents. ASA Grade Assessment: I - A normal, healthy                            patient. After reviewing the risks and benefits,  the patient was deemed in satisfactory condition to                            undergo the procedure.                           After obtaining informed consent, the colonoscope                            was passed under direct vision. Throughout the                            procedure, the patient's blood pressure, pulse, and                            oxygen saturations  were monitored continuously. The                            Colonoscope was introduced through the anus and                            advanced to the the cecum, identified by                            appendiceal orifice and ileocecal valve. The                            ileocecal valve, appendiceal orifice, and rectum                            were photographed. The quality of the bowel                            preparation was excellent. The colonoscopy was                            performed without difficulty. The patient tolerated                            the procedure well. The bowel preparation used was                            SUPREP via split dose instruction. Scope In: 1:19:49 PM Scope Out: 1:38:36 PM Scope Withdrawal Time: 0 hours 13 minutes 3 seconds  Total Procedure Duration: 0 hours 18 minutes 47 seconds  Findings:                 A 1 mm polyp was found in the ascending colon. The                            polyp was removed with a cold snare. Resection and                            retrieval were complete.  The exam was otherwise without abnormality on                            direct and retroflexion views. Complications:            No immediate complications. Estimated blood loss:                            None. Estimated Blood Loss:     Estimated blood loss: none. Impression:               - One 1 mm polyp in the ascending colon, removed                            with a cold snare. Resected and retrieved.                           - The examination was otherwise normal on direct                            and retroflexion views. Recommendation:           - Repeat colonoscopy in 10 years for surveillance.                           - Patient has a contact number available for                            emergencies. The signs and symptoms of potential                            delayed complications were discussed with the                             patient. Return to normal activities tomorrow.                            Written discharge instructions were provided to the                            patient.                           - Resume previous diet.                           - Continue present medications.                           - Await pathology results. Docia Chuck. Parker Pastor, MD 03/14/2019 1:49:40 PM This report has been signed electronically.

## 2019-03-16 ENCOUNTER — Telehealth: Payer: Self-pay | Admitting: *Deleted

## 2019-03-16 NOTE — Telephone Encounter (Signed)
  Follow up Call-  Call back number 03/14/2019  Post procedure Call Back phone  # [phone]  Permission to leave phone message Yes  Some recent data might be hidden     Patient questions:  Do you have a fever, pain , or abdominal swelling? no Pain Score  no  Have you tolerated food without any problems? yes  Have you been able to return to your normal activities? yes  Do you have any questions about your discharge instructions: Diet   no Medications  no Follow up visit  no  Do you have questions or concerns about your Care? no  Actions: * If pain score is 4 or above: No action needed   1. Have you developed a fever since your procedure? no  2.   Have you had an respiratory symptoms (SOB or cough) since your procedure? no  3.   Have you tested positive for COVID 19 since your procedure no  3.   Have you had any family members/close contacts diagnosed with the COVID 19 since your procedure?  no   If any of these questions are a yes, please inquire if patient has been seen by family doctor and route this note to Joylene John, Therapist, sports.

## 2019-03-19 ENCOUNTER — Encounter: Payer: Self-pay | Admitting: Internal Medicine

## 2020-01-23 IMAGING — CT CT ABDOMEN AND PELVIS WITH CONTRAST
2 of 5 series · 16 of 46 positions shown, 18 images · IV contrast (APPLIED)
Comparison: None.

CLINICAL DATA: Abdominal pain and tenderness for a day.

EXAM:
CT ABDOMEN AND PELVIS WITH CONTRAST
TECHNIQUE: Multidetector CT imaging of the abdomen and pelvis was performed
using the standard protocol following bolus administration of
intravenous contrast.
CONTRAST:  100mL OMNIPAQUE IOHEXOL 300 MG/ML  SOLN

[Series 2: axial st · axial · 0.82mm/px · z∈[-525,-110]mm · 13 of 95 slices shown, 15 images]
[im 6/95  soft-tissue]
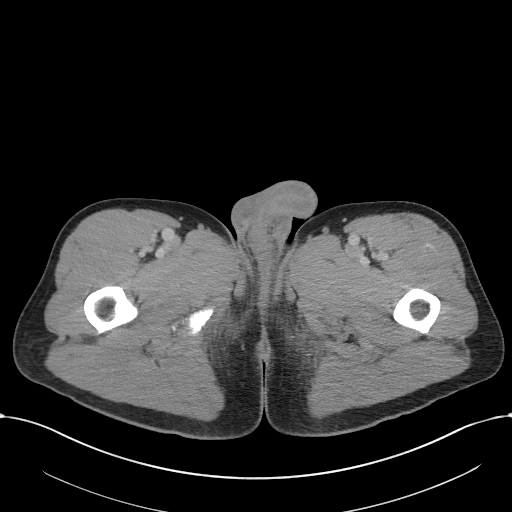
[im 6/95  bone]
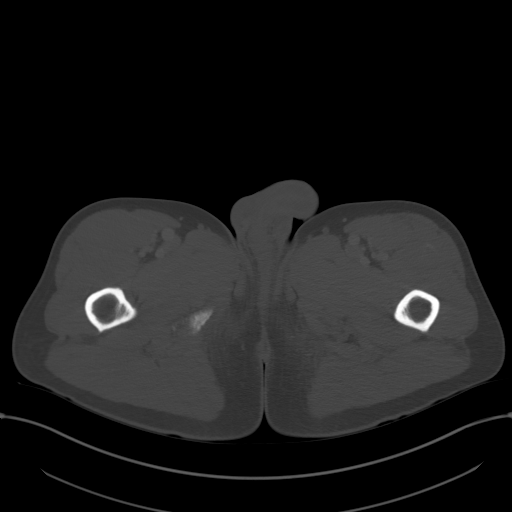
[im 11/95  soft-tissue]
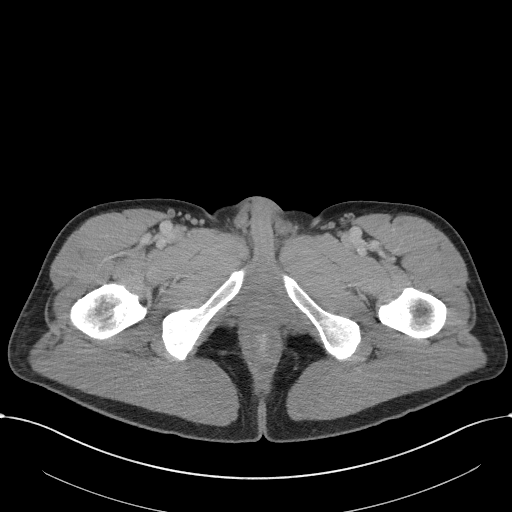
[im 21/95  soft-tissue]
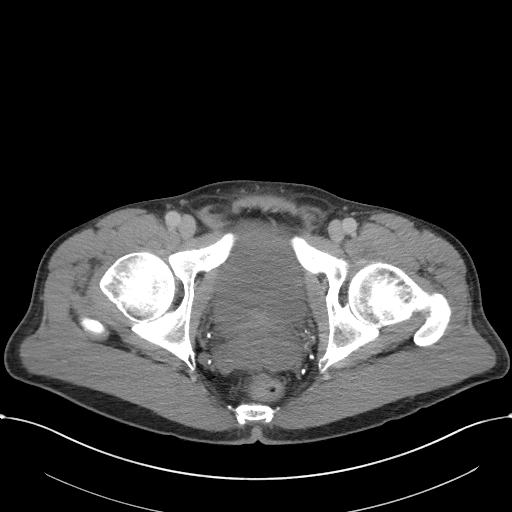
[im 27/95  soft-tissue]
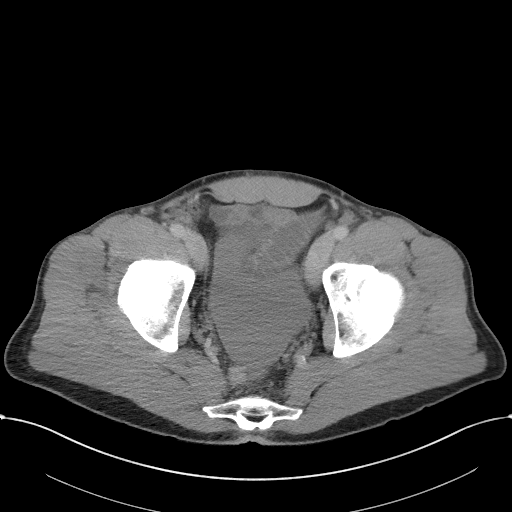
[im 32/95  soft-tissue]
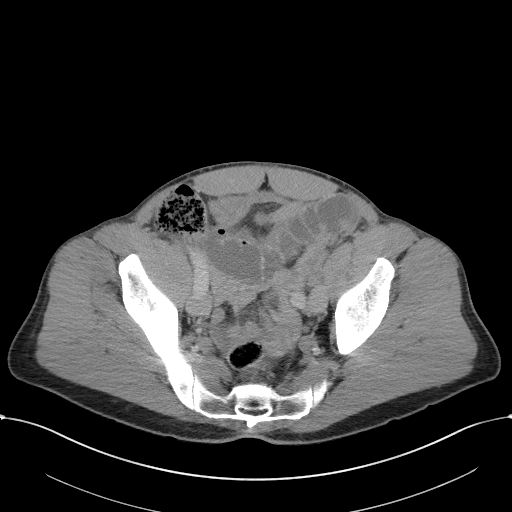
[im 42/95  soft-tissue]
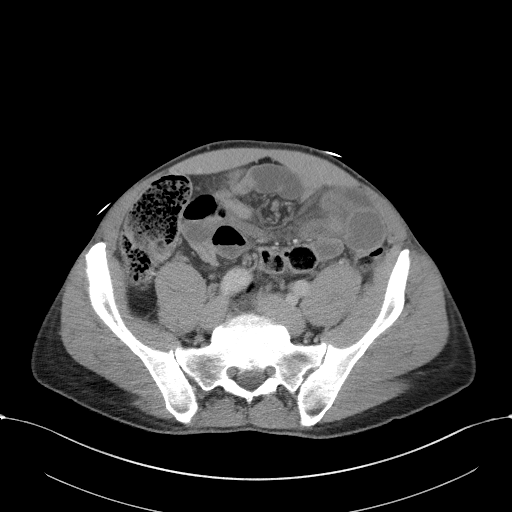
[im 48/95  soft-tissue]
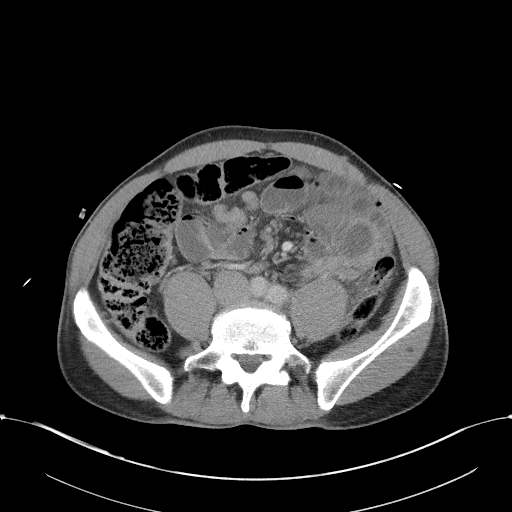
[im 53/95  soft-tissue]
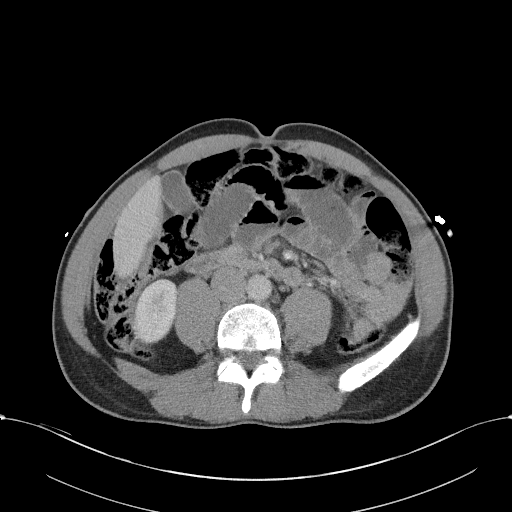
[im 63/95  soft-tissue]
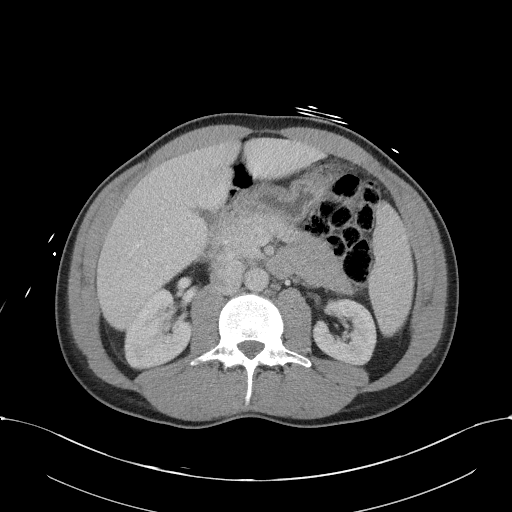
[im 63/95  bone]
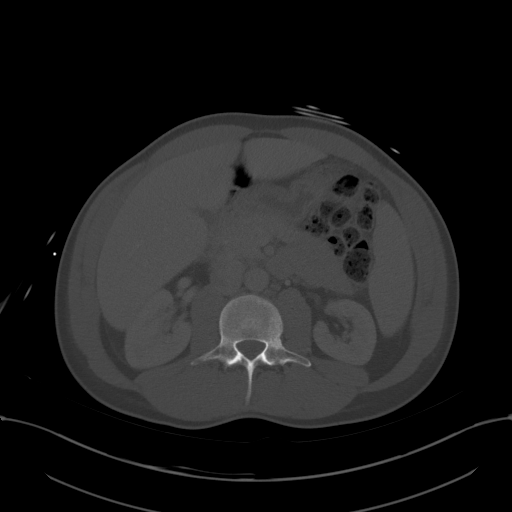
[im 68/95  soft-tissue]
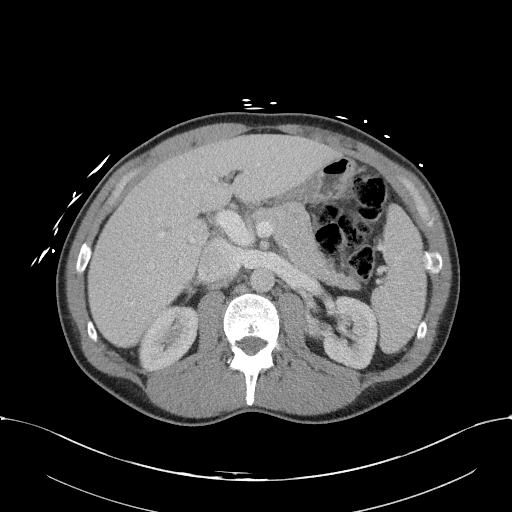
[im 74/95  soft-tissue]
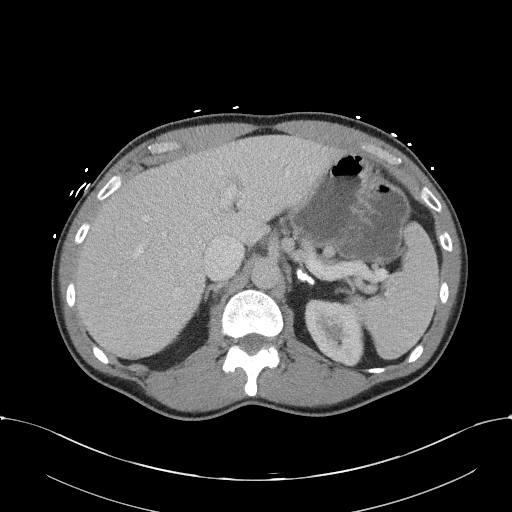
[im 84/95  soft-tissue]
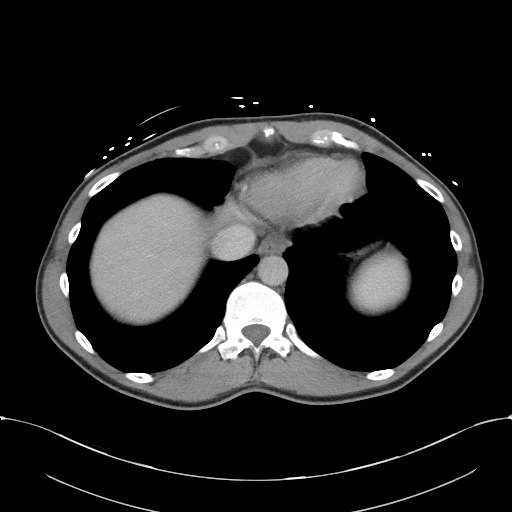
[im 89/95  soft-tissue]
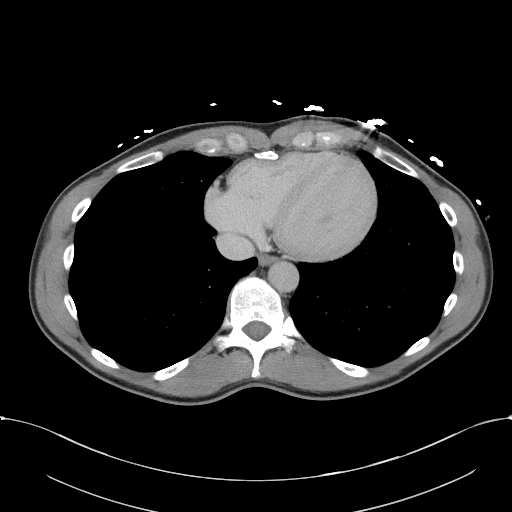

[Series 5: coronal st · coronal · 0.85mm/px · 3 of 101 slices shown]
[im 34/101  soft-tissue]
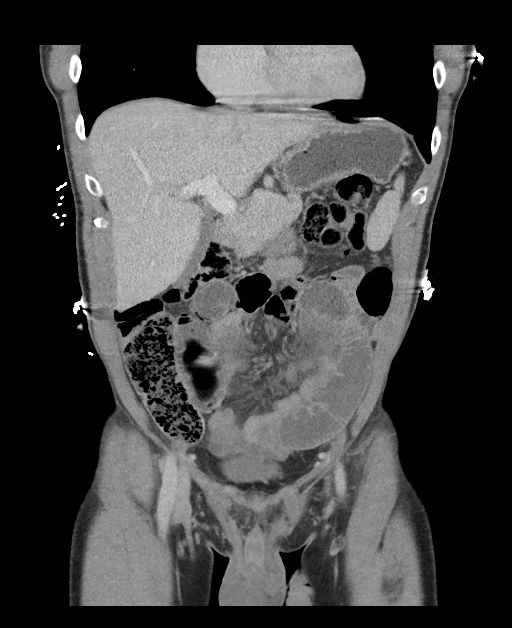
[im 45/101  soft-tissue]
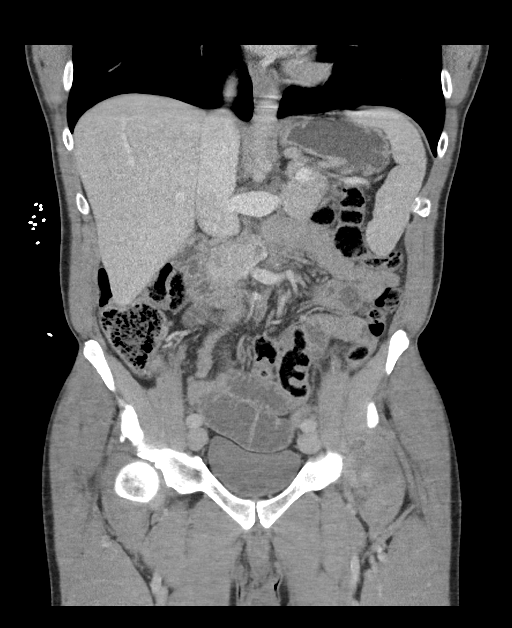
[im 56/101  soft-tissue]
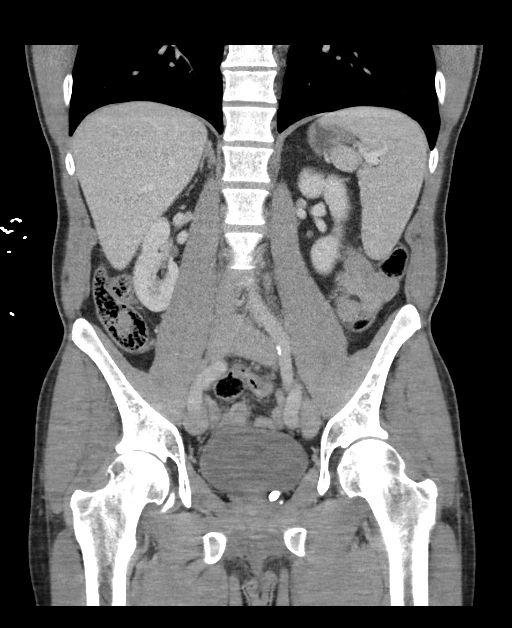

[16 of 46 positions shown; findings below may reference images not displayed]

FINDINGS: Lower chest: No acute abnormality.

Hepatobiliary: No focal liver abnormality is seen. No gallstones,
gallbladder wall thickening, or biliary dilatation.

Pancreas: Unremarkable. No pancreatic ductal dilatation or
surrounding inflammatory changes.

Spleen: Normal in size without focal abnormality.

Adrenals/Urinary Tract: Calcification in the left adrenal gland with
no associated mass, likely from previous hemorrhage or infection.
Right adrenal gland is normal. There is a tiny cyst in the left
kidney. No suspicious renal masses are noted. No hydronephrosis. The
ureters and bladder are normal.

Stomach/Bowel: The stomach is normal. Multiple dilated loops of
small bowel are identified from the mid jejunum to the proximal to
mid ileum. The transition point appears to be in the mid anterior
abdomen in approximately the location of coronal image 31 and axial
image 54. A discrete transition point is not seen. The more distal
small bowel is decompressed. There is fecal loading in the proximal
colon. The remainder of the colon is unremarkable. The appendix is
not well seen but there is no secondary evidence of appendicitis.

Vascular/Lymphatic: Mild atherosclerotic changes in the
nonaneurysmal aorta. The SMV is not well opacified, thought to be
due to timing of contrast.

Reproductive: Prostate is unremarkable.

Other: No free air or free fluid.

Musculoskeletal: No acute or significant osseous findings.
IMPRESSION: 1. The findings are consistent with a small-bowel obstruction. While
a discrete transition point is not seen, I suspect the transition
point is likely in the proximal to mid ileum in the anterior central
abdomen. An underlying cause for obstruction is not identified.
2. Mild atherosclerotic changes in the nonaneurysmal aorta.

## 2020-01-24 IMAGING — DX PORTABLE ABDOMEN - 1 VIEW
2 series · 2 of 2 positions shown · non-contrast
Comparison: 03/03/2019 abdominal radiographs and CT abdomen/pelvis.

CLINICAL DATA: Small bowel obstruction, 8 hour delay image

EXAM:
PORTABLE ABDOMEN - 1 VIEW

[abdomen kub (1 of 2)]
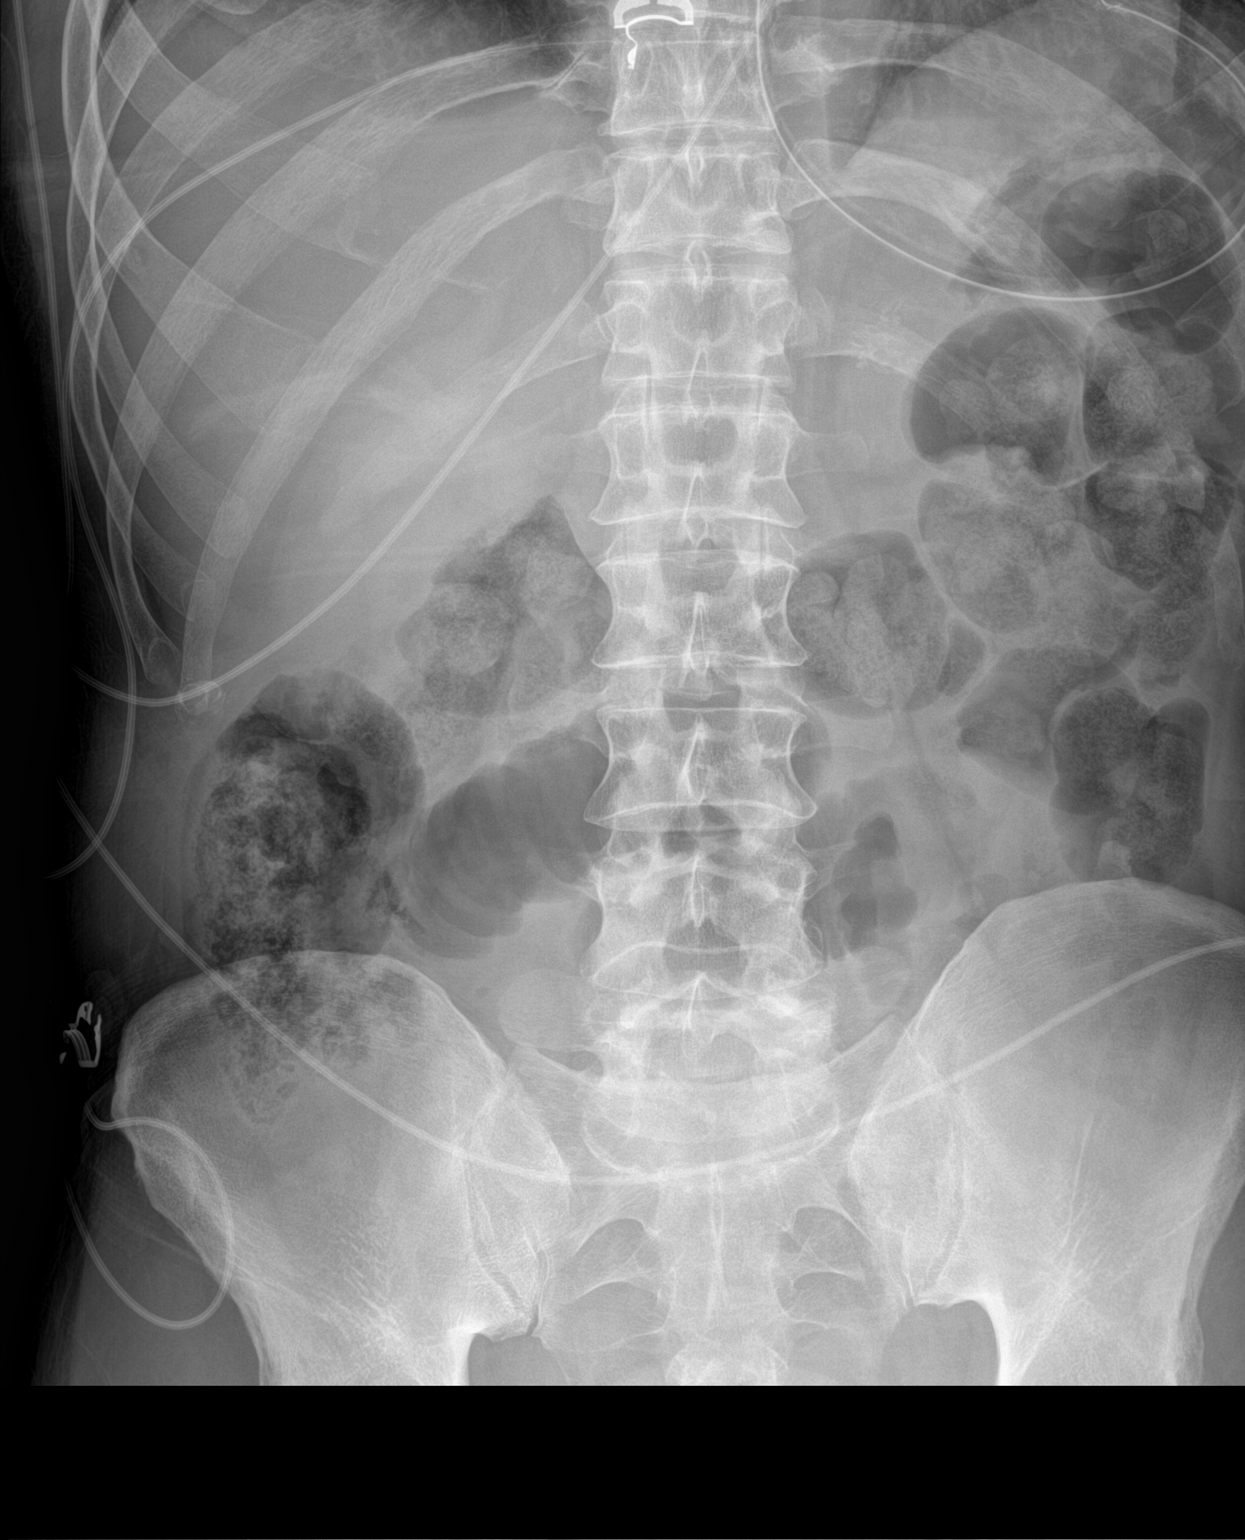

[abdomen kub (2 of 2)]
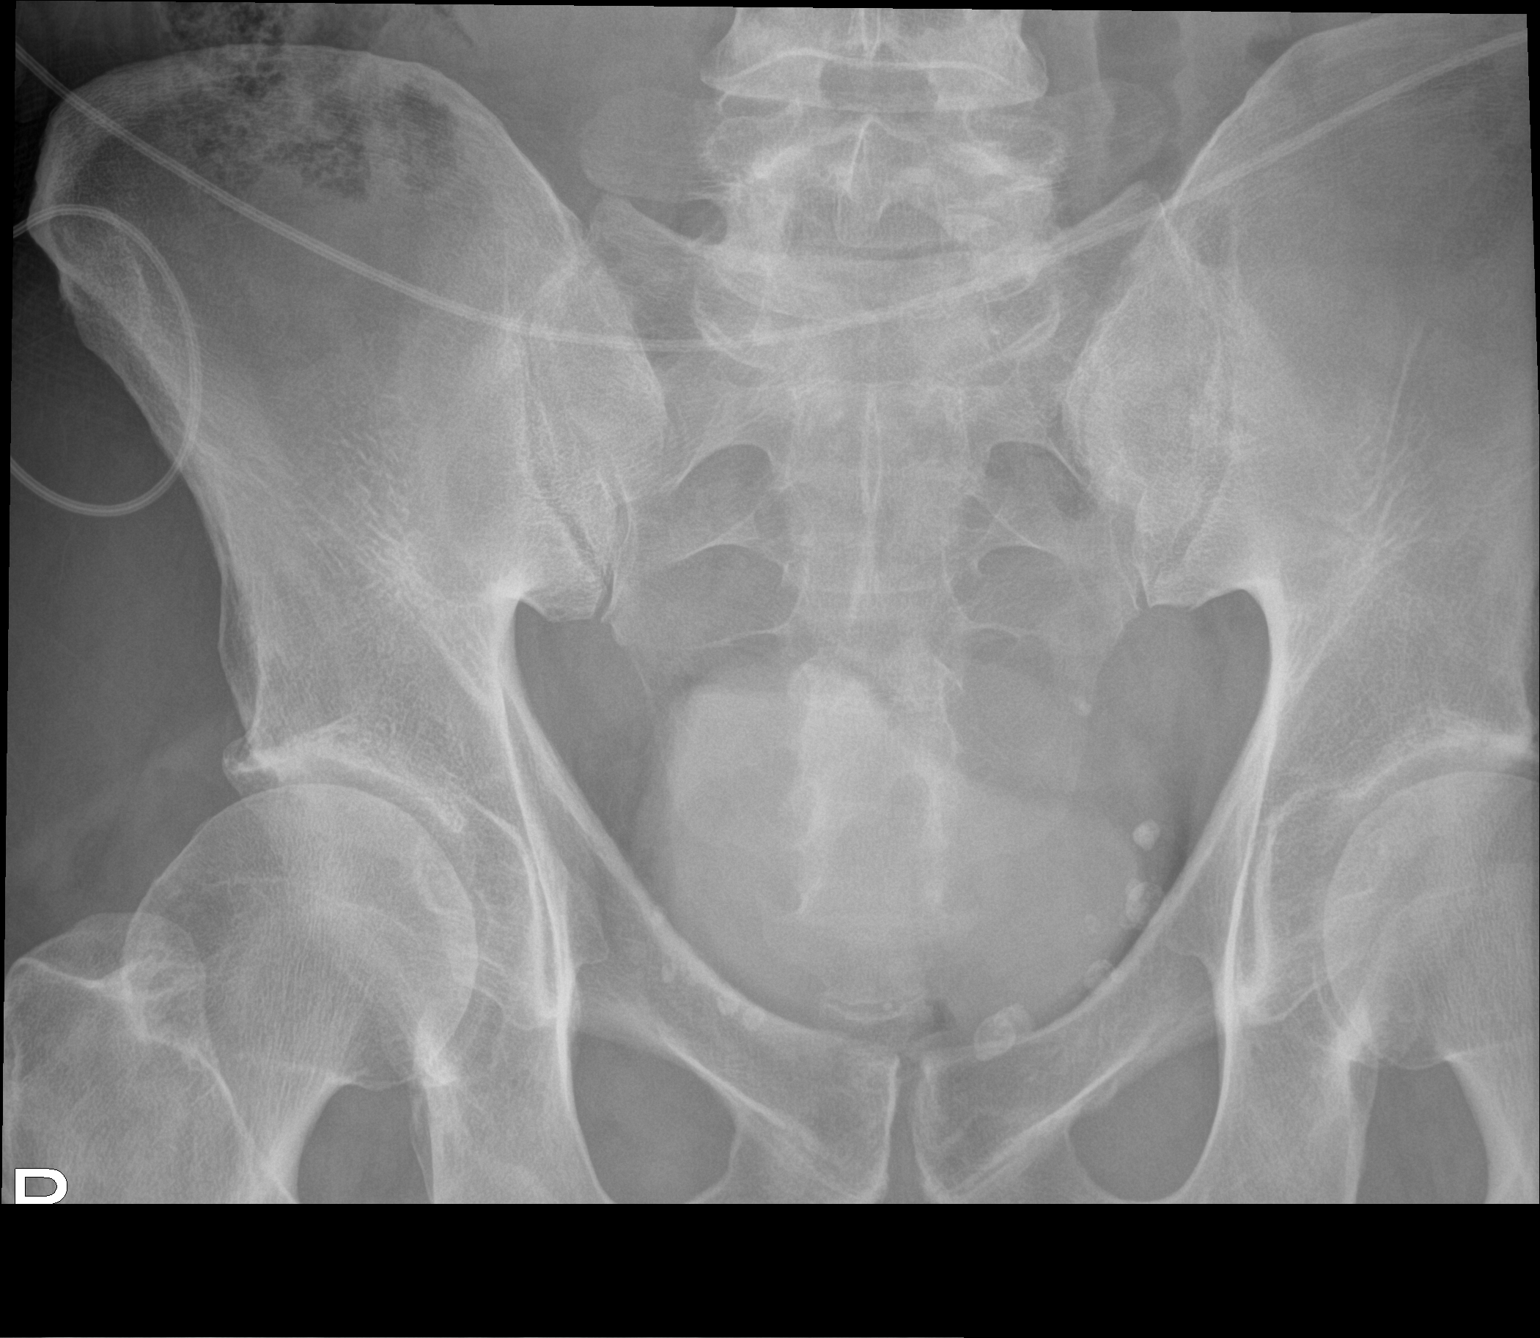

[2 of 2 positions shown; findings below may reference images not displayed]

FINDINGS: Enteric tube terminates in the gastric fundus. Persistent moderate
distention of central abdominal small bowel loops up to 4.4 cm
diameter, stable to mildly worsened. Large colonic stool volume,
unchanged. No evidence of pneumatosis or pneumoperitoneum. No
radiopaque nephrolithiasis. Excreted IV contrast is seen in the
bladder lumen.
IMPRESSION: Persistent moderate distention of central abdominal small bowel
loops, stable mildly worsened, suggesting persistent distal small
bowel obstruction.

Stable large colonic stool volume.

Enteric tube terminates in the gastric fundus.
